# Patient Record
Sex: Male | Born: 1960 | Race: Asian | Hispanic: No | Marital: Married | Smoking: Never smoker
Health system: Southern US, Community
[De-identification: ages and names within clinical notes are randomized; demographics above are authoritative.]

## PROBLEM LIST (undated history)

## (undated) DIAGNOSIS — E7800 Pure hypercholesterolemia, unspecified: Secondary | ICD-10-CM

## (undated) DIAGNOSIS — E119 Type 2 diabetes mellitus without complications: Secondary | ICD-10-CM

## (undated) DIAGNOSIS — K225 Diverticulum of esophagus, acquired: Secondary | ICD-10-CM

## (undated) DIAGNOSIS — E785 Hyperlipidemia, unspecified: Secondary | ICD-10-CM

## (undated) DIAGNOSIS — G5600 Carpal tunnel syndrome, unspecified upper limb: Secondary | ICD-10-CM

## (undated) DIAGNOSIS — I10 Essential (primary) hypertension: Secondary | ICD-10-CM

## (undated) HISTORY — PX: SHOULDER SURGERY: SHX246

## (undated) HISTORY — PX: FOOT SURGERY: SHX648

## (undated) HISTORY — PX: COLONOSCOPY: SHX174

---

## 2004-05-06 ENCOUNTER — Encounter: Admission: RE | Admit: 2004-05-06 | Discharge: 2004-08-04 | Payer: Self-pay | Admitting: Family Medicine

## 2014-06-04 ENCOUNTER — Other Ambulatory Visit: Payer: Self-pay | Admitting: Family Medicine

## 2014-06-04 DIAGNOSIS — R221 Localized swelling, mass and lump, neck: Secondary | ICD-10-CM

## 2014-12-26 ENCOUNTER — Other Ambulatory Visit: Payer: Self-pay | Admitting: Orthopedic Surgery

## 2014-12-28 ENCOUNTER — Encounter (HOSPITAL_BASED_OUTPATIENT_CLINIC_OR_DEPARTMENT_OTHER): Payer: Self-pay | Admitting: *Deleted

## 2014-12-28 ENCOUNTER — Encounter (HOSPITAL_BASED_OUTPATIENT_CLINIC_OR_DEPARTMENT_OTHER)
Admission: RE | Admit: 2014-12-28 | Discharge: 2014-12-28 | Disposition: A | Discharge status: Home / Self Care | Payer: 59 | Source: Ambulatory Visit | Attending: Orthopedic Surgery | Admitting: Orthopedic Surgery

## 2014-12-28 DIAGNOSIS — E785 Hyperlipidemia, unspecified: Secondary | ICD-10-CM | POA: Diagnosis not present

## 2014-12-28 DIAGNOSIS — E119 Type 2 diabetes mellitus without complications: Secondary | ICD-10-CM | POA: Diagnosis not present

## 2014-12-28 DIAGNOSIS — G5602 Carpal tunnel syndrome, left upper limb: Secondary | ICD-10-CM | POA: Diagnosis not present

## 2014-12-28 DIAGNOSIS — R200 Anesthesia of skin: Secondary | ICD-10-CM | POA: Diagnosis present

## 2014-12-28 DIAGNOSIS — I10 Essential (primary) hypertension: Secondary | ICD-10-CM | POA: Diagnosis not present

## 2014-12-28 DIAGNOSIS — Z79899 Other long term (current) drug therapy: Secondary | ICD-10-CM | POA: Diagnosis not present

## 2014-12-28 LAB — BASIC METABOLIC PANEL
Anion gap: 10 (ref 5–15)
BUN: 15 mg/dL (ref 6–20)
CHLORIDE: 100 mmol/L — AB (ref 101–111)
CO2: 26 mmol/L (ref 22–32)
CREATININE: 1.03 mg/dL (ref 0.61–1.24)
Calcium: 9.5 mg/dL (ref 8.9–10.3)
GFR calc Af Amer: >60 mL/min (ref >60)
GFR calc non Af Amer: >60 mL/min (ref >60)
GLUCOSE: 186 mg/dL — AB (ref 65–99)
Potassium: 4.8 mmol/L (ref 3.5–5.1)
Sodium: 136 mmol/L (ref 135–145)

## 2014-12-31 ENCOUNTER — Ambulatory Visit (HOSPITAL_BASED_OUTPATIENT_CLINIC_OR_DEPARTMENT_OTHER): Payer: 59 | Admitting: Anesthesiology

## 2014-12-31 ENCOUNTER — Encounter (HOSPITAL_BASED_OUTPATIENT_CLINIC_OR_DEPARTMENT_OTHER)
Admission: RE | Disposition: A | Discharge status: Home / Self Care | Payer: Self-pay | Source: Ambulatory Visit | Attending: Orthopedic Surgery

## 2014-12-31 ENCOUNTER — Encounter (HOSPITAL_BASED_OUTPATIENT_CLINIC_OR_DEPARTMENT_OTHER): Payer: Self-pay | Admitting: *Deleted

## 2014-12-31 ENCOUNTER — Ambulatory Visit (HOSPITAL_BASED_OUTPATIENT_CLINIC_OR_DEPARTMENT_OTHER)
Admission: RE | Admit: 2014-12-31 | Discharge: 2014-12-31 | Disposition: A | Discharge status: Home / Self Care | Payer: 59 | Source: Ambulatory Visit | Attending: Orthopedic Surgery | Admitting: Orthopedic Surgery

## 2014-12-31 DIAGNOSIS — G5602 Carpal tunnel syndrome, left upper limb: Secondary | ICD-10-CM | POA: Diagnosis not present

## 2014-12-31 DIAGNOSIS — E785 Hyperlipidemia, unspecified: Secondary | ICD-10-CM | POA: Insufficient documentation

## 2014-12-31 DIAGNOSIS — Z79899 Other long term (current) drug therapy: Secondary | ICD-10-CM | POA: Insufficient documentation

## 2014-12-31 DIAGNOSIS — I10 Essential (primary) hypertension: Secondary | ICD-10-CM | POA: Insufficient documentation

## 2014-12-31 DIAGNOSIS — E119 Type 2 diabetes mellitus without complications: Secondary | ICD-10-CM | POA: Insufficient documentation

## 2014-12-31 HISTORY — PX: CARPAL TUNNEL RELEASE: SHX101

## 2014-12-31 HISTORY — DX: Carpal tunnel syndrome, unspecified upper limb: G56.00

## 2014-12-31 HISTORY — DX: Essential (primary) hypertension: I10

## 2014-12-31 HISTORY — DX: Hyperlipidemia, unspecified: E78.5

## 2014-12-31 HISTORY — DX: Type 2 diabetes mellitus without complications: E11.9

## 2014-12-31 LAB — GLUCOSE, CAPILLARY
Glucose-Capillary: 186 mg/dL — ABNORMAL HIGH (ref 65–99)
Glucose-Capillary: 166 mg/dL — ABNORMAL HIGH (ref 65–99)

## 2014-12-31 LAB — POCT HEMOGLOBIN-HEMACUE: Hemoglobin: 15.6 g/dL (ref 13.0–17.0)

## 2014-12-31 SURGERY — RELEASE, CARPAL TUNNEL, ENDOSCOPIC
Anesthesia: Monitor Anesthesia Care | Site: Wrist | Laterality: Left

## 2014-12-31 MED ORDER — ONDANSETRON HCL 4 MG/2ML IJ SOLN
INTRAMUSCULAR | Status: DC | PRN
  Administered 2014-12-31: 4 mg via INTRAVENOUS

## 2014-12-31 MED ORDER — LIDOCAINE HCL (CARDIAC) 20 MG/ML IV SOLN
INTRAVENOUS | Status: DC | PRN
  Administered 2014-12-31: 20 mg via INTRAVENOUS

## 2014-12-31 MED ORDER — PROPOFOL 10 MG/ML IV BOLUS
INTRAVENOUS | Status: DC | PRN
  Administered 2014-12-31: 20 mg via INTRAVENOUS

## 2014-12-31 MED ORDER — GLYCOPYRROLATE 0.2 MG/ML IJ SOLN
0.2000 mg | Freq: Once | INTRAMUSCULAR | Status: DC | PRN
Start: 2014-12-31 — End: 2014-12-31

## 2014-12-31 MED ORDER — PROPOFOL INFUSION 10 MG/ML OPTIME: INTRAVENOUS | Status: DC | PRN

## 2014-12-31 MED ORDER — LACTATED RINGERS IV SOLN: INTRAVENOUS | Status: DC

## 2014-12-31 MED ORDER — LIDOCAINE HCL 2 % IJ SOLN
INTRAMUSCULAR | Status: AC
  Filled 2014-12-31: qty 20

## 2014-12-31 MED ORDER — LIDOCAINE HCL 2 % IJ SOLN
INTRAMUSCULAR | Status: DC | PRN
  Administered 2014-12-31: 5 mL

## 2014-12-31 MED ORDER — CEFAZOLIN SODIUM-DEXTROSE 2-3 GM-% IV SOLR
2.0000 g | INTRAVENOUS | Status: AC
  Administered 2014-12-31: 2 g via INTRAVENOUS

## 2014-12-31 MED ORDER — HYDROMORPHONE HCL 1 MG/ML IJ SOLN: 0.2500 mg | INTRAMUSCULAR | Status: DC | PRN

## 2014-12-31 MED ORDER — FENTANYL CITRATE (PF) 100 MCG/2ML IJ SOLN
INTRAMUSCULAR | Status: DC | PRN
  Administered 2014-12-31: 100 ug via INTRAVENOUS

## 2014-12-31 MED ORDER — MIDAZOLAM HCL 2 MG/2ML IJ SOLN
INTRAMUSCULAR | Status: AC
  Filled 2014-12-31: qty 2

## 2014-12-31 MED ORDER — BUPIVACAINE-EPINEPHRINE (PF) 0.5% -1:200000 IJ SOLN
INTRAMUSCULAR | Status: DC | PRN
Start: 2014-12-31 — End: 2014-12-31
  Administered 2014-12-31: 5 mL

## 2014-12-31 MED ORDER — LACTATED RINGERS IV SOLN
INTRAVENOUS | Status: DC
  Administered 2014-12-31: 11:00:00 via INTRAVENOUS

## 2014-12-31 MED ORDER — MIDAZOLAM HCL 2 MG/2ML IJ SOLN
1.0000 mg | INTRAMUSCULAR | Status: DC | PRN
  Administered 2014-12-31: 2 mg via INTRAVENOUS

## 2014-12-31 MED ORDER — CEFAZOLIN SODIUM-DEXTROSE 2-3 GM-% IV SOLR
INTRAVENOUS | Status: AC
  Filled 2014-12-31: qty 50

## 2014-12-31 MED ORDER — FENTANYL CITRATE (PF) 100 MCG/2ML IJ SOLN
50.0000 ug | INTRAMUSCULAR | Status: DC | PRN
Start: 2014-12-31 — End: 2014-12-31

## 2014-12-31 MED ORDER — FENTANYL CITRATE (PF) 100 MCG/2ML IJ SOLN
INTRAMUSCULAR | Status: AC
  Filled 2014-12-31: qty 4

## 2014-12-31 MED ORDER — PROMETHAZINE HCL 25 MG/ML IJ SOLN: 6.2500 mg | INTRAMUSCULAR | Status: DC | PRN

## 2014-12-31 MED ORDER — BUPIVACAINE-EPINEPHRINE (PF) 0.5% -1:200000 IJ SOLN
INTRAMUSCULAR | Status: AC
  Filled 2014-12-31: qty 30

## 2014-12-31 SURGICAL SUPPLY — 40 items
APPLICATOR COTTON TIP 6IN STRL (MISCELLANEOUS) ×2 IMPLANT
BLADE HOOK ENDO STRL (BLADE) ×2 IMPLANT
BLADE SURG 15 STRL LF DISP TIS (BLADE) ×1 IMPLANT
BLADE SURG 15 STRL SS (BLADE) ×1
BLADE TRIANGLE EPF/EGR ENDO (BLADE) ×2 IMPLANT
BNDG COHESIVE 4X5 TAN STRL (GAUZE/BANDAGES/DRESSINGS) ×2 IMPLANT
BNDG ESMARK 4X9 LF (GAUZE/BANDAGES/DRESSINGS) ×2 IMPLANT
BNDG GAUZE ELAST 4 BULKY (GAUZE/BANDAGES/DRESSINGS) ×2 IMPLANT
CHLORAPREP W/TINT 26ML (MISCELLANEOUS) ×2 IMPLANT
CORDS BIPOLAR (ELECTRODE) IMPLANT
COVER BACK TABLE 60X90IN (DRAPES) ×2 IMPLANT
COVER MAYO STAND STRL (DRAPES) ×2 IMPLANT
CUFF TOURNIQUET SINGLE 18IN (TOURNIQUET CUFF) ×2 IMPLANT
DRAIN TLS ROUND 10FR (DRAIN) ×2 IMPLANT
DRAPE EXTREMITY T 121X128X90 (DRAPE) ×2 IMPLANT
DRAPE SURG 17X23 STRL (DRAPES) ×2 IMPLANT
DRSG EMULSION OIL 3X3 NADH (GAUZE/BANDAGES/DRESSINGS) ×2 IMPLANT
GLOVE BIO SURGEON STRL SZ7.5 (GLOVE) ×2 IMPLANT
GLOVE BIOGEL PI IND STRL 7.0 (GLOVE) ×1 IMPLANT
GLOVE BIOGEL PI IND STRL 8 (GLOVE) ×1 IMPLANT
GLOVE BIOGEL PI INDICATOR 7.0 (GLOVE) ×1
GLOVE BIOGEL PI INDICATOR 8 (GLOVE) ×1
GLOVE ECLIPSE 6.5 STRL STRAW (GLOVE) ×4 IMPLANT
GOWN STRL REUS W/ TWL LRG LVL3 (GOWN DISPOSABLE) ×2 IMPLANT
GOWN STRL REUS W/TWL LRG LVL3 (GOWN DISPOSABLE) ×2
GOWN STRL REUS W/TWL XL LVL3 (GOWN DISPOSABLE) ×2 IMPLANT
NEEDLE HYPO 25X1 1.5 SAFETY (NEEDLE) ×2 IMPLANT
NS IRRIG 1000ML POUR BTL (IV SOLUTION) ×2 IMPLANT
PACK BASIN DAY SURGERY FS (CUSTOM PROCEDURE TRAY) ×2 IMPLANT
PADDING CAST ABS 4INX4YD NS (CAST SUPPLIES) ×1
PADDING CAST ABS COTTON 4X4 ST (CAST SUPPLIES) ×1 IMPLANT
SPONGE GAUZE 4X4 12PLY STER LF (GAUZE/BANDAGES/DRESSINGS) ×2 IMPLANT
STOCKINETTE 6  STRL (DRAPES) ×1
STOCKINETTE 6 STRL (DRAPES) ×1 IMPLANT
SUT VICRYL RAPIDE 4-0 (SUTURE) ×2 IMPLANT
SYR BULB 3OZ (MISCELLANEOUS) ×2 IMPLANT
SYRINGE 10CC LL (SYRINGE) ×2 IMPLANT
TOWEL OR 17X24 6PK STRL BLUE (TOWEL DISPOSABLE) ×4 IMPLANT
TOWEL OR NON WOVEN STRL DISP B (DISPOSABLE) ×2 IMPLANT
UNDERPAD 30X30 (UNDERPADS AND DIAPERS) ×2 IMPLANT

## 2014-12-31 NOTE — Anesthesia Postprocedure Evaluation (Signed)
  Anesthesia Post-op Note  Patient: Timothy RippleNiaz Moss  Procedure(s) Performed: Procedure(s): LEFT ENDOSCOPIC CARPAL TUNNEL RELEASE  (Left)  Patient Location: PACU  Anesthesia Type:MAC  Level of Consciousness: awake  Airway and Oxygen Therapy: Patient Spontanous Breathing  Post-op Pain: mild  Post-op Assessment: Post-op Vital signs reviewed  Post-op Vital Signs: Reviewed  Last Vitals:  Filed Vitals:   12/31/14 1350  BP: 140/84  Pulse: 67  Temp: 36.5 C  Resp: 16    Complications: No apparent anesthesia complications

## 2014-12-31 NOTE — H&P (Signed)
Timothy Moss is an 54 y.o. male.   Chief Complaint: L hand N/T HPI: h/o L hand N/T in MN distribution, worse at sleep and certain activities.  NCS/EMG c/w CTS.  Non-op mgmnt failed.  Past Medical History  Diagnosis Date  . Diabetes mellitus without complication   . Hypertension   . CTS (carpal tunnel syndrome)   . Hyperlipemia     Past Surgical History  Procedure Laterality Date  . Colonoscopy      History reviewed. No pertinent family history. Social History:  reports that he has never smoked. He does not have any smokeless tobacco history on file. He reports that he does not drink alcohol or use illicit drugs.  Allergies: No Known Allergies  Medications Prior to Admission  Medication Sig Dispense Refill  . fenofibrate (TRICOR) 145 MG tablet Take 145 mg by mouth daily.    Marland Kitchen. losartan (COZAAR) 50 MG tablet Take 50 mg by mouth daily.    . metFORMIN (GLUCOPHAGE) 500 MG tablet Take by mouth 2 (two) times daily with a meal.    . pravastatin (PRAVACHOL) 40 MG tablet Take 40 mg by mouth daily.      Results for orders placed or performed during the hospital encounter of 12/31/14 (from the past 48 hour(s))  Glucose, capillary     Status: Abnormal   Collection Time: 12/31/14 10:36 AM  Result Value Ref Range   Glucose-Capillary 186 (H) 65 - 99 mg/dL   No results found.  Review of Systems  All other systems reviewed and are negative.   Blood pressure 151/88, pulse 71, temperature 97.8 F (36.6 C), temperature source Oral, resp. rate 20, height 5\' 8"  (1.727 m), weight 79.379 kg (175 lb), SpO2 100 %. Physical Exam  Constitutional: He is oriented to person, place, and time. He appears well-developed and well-nourished.  HENT:  Head: Normocephalic and atraumatic.  Neck: Normal range of motion.  Cardiovascular: Intact distal pulses.   Respiratory: Effort normal.  GI: Soft.  Musculoskeletal: Normal range of motion.  +phalens on L, +tinels over MN at wrist  Neurological: He is  alert and oriented to person, place, and time. He has normal reflexes.  Skin: Skin is warm and dry.  Psychiatric: He has a normal mood and affect. His behavior is normal. Judgment and thought content normal.     Assessment/Plan L CTS  To OR for ECTR, G/R/O reviewed and consent obtained.  Endiya Klahr A. 12/31/2014, 11:56 AM

## 2014-12-31 NOTE — Op Note (Signed)
12/31/2014  11:59 AM  PATIENT:  Timothy RippleNiaz Moss  54 y.o. male  PRE-OPERATIVE DIAGNOSIS:  left carpal tunnel syndrome  POST-OPERATIVE DIAGNOSIS:  Same  PROCEDURE:  Procedure(s): LEFT ENDOSCOPIC CARPAL TUNNEL RELEASE   SURGEON:  Surgeon(s): Mack Hookavid Merriel Zinger, MD  PHYSICIAN ASSISTANT: Danielle RankinKirsten Schrader, OPA-C  ANESTHESIA:  Local / MAC  SPECIMENS:  None  DRAINS:   TLS x 1  EBL:  less than 50 mL  PREOPERATIVE INDICATIONS:  Timothy Moss is a  54 y.o. male with a diagnosis of left carpal tunnel syndrome who failed conservative measures and elected for surgical management.    The risks benefits and alternatives were discussed with the patient preoperatively including but not limited to the risks of infection, bleeding, nerve injury, cardiopulmonary complications, the need for revision surgery, among others, and the patient verbalized understanding and consented to proceed.  OPERATIVE IMPLANTS: None  OPERATIVE FINDINGS: Tight carpal tunnel, acceptably decompressed following release  OPERATIVE PROCEDURE:  After receiving prophylactic antibiotics, the patient was escorted to the operative theatre and placed in a supine position.   A surgical "time-out" was performed during which the planned procedure, proposed operative site, and the correct patient identity were compared to the operative consent and agreement confirmed by the circulating nurse according to current facility policy.  Following application of a tourniquet to the operative extremity, the planned incisions were marked and anesthetized with a mixture of lidocaine & bupivicaine containing epinephrine.  The exposed skin was prepped with Chloraprep and draped in the usual sterile fashion.  The limb was exsanguinated with an Esmarch bandage and the tourniquet inflated to approximately 100mmHg higher than systolic BP.   The incisions were made sharply. Subcutaneous tissues were dissected with blunt and spreading dissection. At the  proximal incision, the deep forearm fascia was split in line with the skin incision the distal edge grasped with a hemostat. At the mid palmar incision, the palmar fascia was split in line with the skin incision, revealing the underlying superficial palmar arch which was visualized with loupe assisted magnification. The synovial reflector was then introduced into the proximal incision and passed through the carpal canal, uses to reflect synovium from the deep surface of the transverse carpal ligament. It was removed and replaced with the slotted cannula and blunt obturator, passing from proximal to distal and exiting the distal wound superficial to the superficial palmar arch. The obturator was removed and the camera inserted. Visualization of the ligament was acceptable. The triangle shape blade was then inserted distally, advanced to the midportion of the ligament, and there used to create a perforation in the ligament. This instrument was removed and the hooked nstrument was inserted. It was placed into the perforation in the ligament and withdrawn distally, completing transection of the distal half of the ligament. The camera was then removed and placed into the distal end of the cannula. The hooked instrument was placed into the proximal end and advanced facility to be placed into the apex of the V. which had been formed to the distal hemi-transection of the ligament. It was withdrawn proximally, completing transection of the ligament. The adequacy of the release was judged with the scope and the instruments used as a probe. All of the endoscopic instruments are removed and the adequacy of release was again judged from the proximal incision perspective with direct loupe assisted visualization. In addition, the proximal forearm fascia was split for 2 inches proximal to the proximal incision under direct visualization using a sliding scissor technique. The tourniquet  was released, the wound copiously irrigated. A  TLS drain was placed to lie alongside the nerve exiting its own skin hole distally made with some sharp trocar. The skin was then closed with 4-0 Vicryl Rapide interrupted sutures. A light dressing was applied and the balance of 10 mL of the initial anesthetic mixture was placed down the drain, and the drain was clamped to be unclamped in the recovery room.  DISPOSITION:  The patient will be discharged home today, returning in 10-15 days for re-assessment.

## 2014-12-31 NOTE — Discharge Instructions (Signed)
Discharge Instructions   You have a light dressing on your hand.  You may begin gentle motion of your fingers and hand immediately, but you should not do any heavy lifting or gripping.  Elevate your hand to reduce pain & swelling of the digits.  Ice over the operative site may be helpful to reduce pain & swelling.  DO NOT USE HEAT. Pain medicine has been prescribed for you.  Use your medicine as needed over the first 48 hours, and then you can begin to taper your use. You may use Tylenol in place of your prescribed pain medication, but not IN ADDITION to it. Leave the dressing in place until the third day after your surgery and then remove it, leaving it open to air.  After the bandage has been removed you may shower, regularly washing the incision and letting the water run over it, but not submerging it (no swimming, soaking it in dishwater, etc.) You may drive a car when you are off of prescription pain medications and can safely control your vehicle with both hands. We will address whether therapy will be required or not when you return to the office. You may have already made your follow-up appointment when we completed your preop visit.  If not, please call our office today or the next business day to make your return appointment for 10-15 days after surgery.   Please call [phone] during normal business hours or [phone] after hours for any problems. Including the following:  - excessive redness of the incisions - drainage for more than 4 days - fever of more than 101.5 F  *Please note that pain medications will not be refilled after hours or on weekends.  Post Anesthesia Home Care Instructions  Activity: Get plenty of rest for the remainder of the day. A responsible adult should stay with you for 24 hours following the procedure.  For the next 24 hours, DO NOT: -Drive a car -Advertising copywriterperate machinery -Drink alcoholic beverages -Take any medication unless instructed by your  physician -Make any legal decisions or sign important papers.  Meals: Start with liquid foods such as gelatin or soup. Progress to regular foods as tolerated. Avoid greasy, spicy, heavy foods. If nausea and/or vomiting occur, drink only clear liquids until the nausea and/or vomiting subsides. Call your physician if vomiting continues.  Special Instructions/Symptoms: Your throat may feel dry or sore from the anesthesia or the breathing tube placed in your throat during surgery. If this causes discomfort, gargle with warm salt water. The discomfort should disappear within 24 hours.  If you had a scopolamine patch placed behind your ear for the management of post- operative nausea and/or vomiting:  1. The medication in the patch is effective for 72 hours, after which it should be removed.  Wrap patch in a tissue and discard in the trash. Wash hands thoroughly with soap and water. 2. You may remove the patch earlier than 72 hours if you experience unpleasant side effects which may include dry mouth, dizziness or visual disturbances. 3. Avoid touching the patch. Wash your hands with soap and water after contact with the patch.   TLS Drain Instructions You have a drain tube in place to help limit the amount of blood that collects under your skin in the early post-operative period.  The amount it drains will vary from person-to-person and is dependent upon many factors.  You will have 1-2 extra drain tubes sent with you from the facility.  You should change the tube  according to these instructions below when the tube is about half full.  BE SURE TO SLIDE THE CLAMP TO THE CLOSED POSITION BEFORE CHANGING THE TUBE.    RE-OPEN THE CLAMP ONCE THE GLASS EVACUATION TUBE HAS BEEN CHANGED.  24 hours after surgery the amount of drainage will likely be negligible and it will be time to remove the tube and discard it.  Simply remove the tape that secures the flexible plastic drainage tube to the bandage and  briskly pull the tube straight out.  You will likely feel a little discomfort during this process, but the tube should slide out as it is not sewn or otherwise secured directly to your body.  It is merely held in place by the bandage itself.                              If you are not clear about when your first post-op appointment is scheduled, please call the office on the next business day to inquire about it.  Please also call the office if you have any other questions [phone]).

## 2014-12-31 NOTE — Anesthesia Preprocedure Evaluation (Addendum)
Anesthesia Evaluation  Patient identified by MRN, date of birth, ID band Patient awake    Reviewed: Allergy & Precautions, NPO status , Patient's Chart, lab work & pertinent test results  Airway Mallampati: II  TM Distance: >3 FB Neck ROM: Full    Dental   Pulmonary neg pulmonary ROS,  breath sounds clear to auscultation        Cardiovascular hypertension, Rhythm:Regular Rate:Normal     Neuro/Psych    GI/Hepatic negative GI ROS, Neg liver ROS,   Endo/Other  diabetes  Renal/GU negative Renal ROS     Musculoskeletal   Abdominal   Peds  Hematology   Anesthesia Other Findings   Reproductive/Obstetrics                            Anesthesia Physical Anesthesia Plan  ASA: II  Anesthesia Plan: MAC   Post-op Pain Management:    Induction: Intravenous  Airway Management Planned: Simple Face Mask  Additional Equipment:   Intra-op Plan:   Post-operative Plan:   Informed Consent: I have reviewed the patients History and Physical, chart, labs and discussed the procedure including the risks, benefits and alternatives for the proposed anesthesia with the patient or authorized representative who has indicated his/her understanding and acceptance.   Dental advisory given  Plan Discussed with: CRNA and Anesthesiologist  Anesthesia Plan Comments:        Anesthesia Quick Evaluation

## 2014-12-31 NOTE — Anesthesia Procedure Notes (Signed)
Procedure Name: MAC Performed by: Keyara Ent W Pre-anesthesia Checklist: Patient identified, Timeout performed, Emergency Drugs available, Suction available and Patient being monitored Patient Re-evaluated:Patient Re-evaluated prior to inductionOxygen Delivery Method: Simple face mask Placement Confirmation: positive ETCO2     

## 2014-12-31 NOTE — Transfer of Care (Signed)
Immediate Anesthesia Transfer of Care Note  Patient: Timothy Moss  Procedure(s) Performed: Procedure(s): LEFT ENDOSCOPIC CARPAL TUNNEL RELEASE  (Left)  Patient Location: PACU  Anesthesia Type:MAC  Level of Consciousness: awake, alert  and oriented  Airway & Oxygen Therapy: Patient Spontanous Breathing and Patient connected to face mask oxygen  Post-op Assessment: Report given to RN and Post -op Vital signs reviewed and stable  Post vital signs: Reviewed and stable  Last Vitals:  Filed Vitals:   12/31/14 1028  BP: 151/88  Pulse: 71  Temp: 36.6 C  Resp: 20    Complications: No apparent anesthesia complications

## 2015-01-01 ENCOUNTER — Encounter (HOSPITAL_BASED_OUTPATIENT_CLINIC_OR_DEPARTMENT_OTHER): Payer: Self-pay | Admitting: Orthopedic Surgery

## 2015-05-01 ENCOUNTER — Ambulatory Visit
Admission: RE | Admit: 2015-05-01 | Discharge: 2015-05-01 | Disposition: A | Discharge status: Home / Self Care | Payer: 59 | Source: Ambulatory Visit | Attending: Family Medicine | Admitting: Family Medicine

## 2015-05-01 ENCOUNTER — Other Ambulatory Visit: Payer: Self-pay | Admitting: Family Medicine

## 2015-05-01 DIAGNOSIS — M545 Low back pain: Secondary | ICD-10-CM

## 2015-11-13 DIAGNOSIS — R05 Cough: Secondary | ICD-10-CM | POA: Diagnosis not present

## 2015-11-13 DIAGNOSIS — J309 Allergic rhinitis, unspecified: Secondary | ICD-10-CM | POA: Diagnosis not present

## 2015-12-19 DIAGNOSIS — J309 Allergic rhinitis, unspecified: Secondary | ICD-10-CM | POA: Diagnosis not present

## 2015-12-19 DIAGNOSIS — E1329 Other specified diabetes mellitus with other diabetic kidney complication: Secondary | ICD-10-CM | POA: Diagnosis not present

## 2015-12-19 DIAGNOSIS — R05 Cough: Secondary | ICD-10-CM | POA: Diagnosis not present

## 2016-01-09 DIAGNOSIS — M79671 Pain in right foot: Secondary | ICD-10-CM | POA: Diagnosis not present

## 2016-01-17 DIAGNOSIS — D225 Melanocytic nevi of trunk: Secondary | ICD-10-CM | POA: Diagnosis not present

## 2016-01-17 DIAGNOSIS — Z1283 Encounter for screening for malignant neoplasm of skin: Secondary | ICD-10-CM | POA: Diagnosis not present

## 2016-02-20 DIAGNOSIS — M25571 Pain in right ankle and joints of right foot: Secondary | ICD-10-CM | POA: Diagnosis not present

## 2016-02-20 DIAGNOSIS — M7731 Calcaneal spur, right foot: Secondary | ICD-10-CM | POA: Diagnosis not present

## 2016-02-20 DIAGNOSIS — M7661 Achilles tendinitis, right leg: Secondary | ICD-10-CM | POA: Diagnosis not present

## 2016-02-20 DIAGNOSIS — M6701 Short Achilles tendon (acquired), right ankle: Secondary | ICD-10-CM | POA: Diagnosis not present

## 2016-03-03 DIAGNOSIS — M25571 Pain in right ankle and joints of right foot: Secondary | ICD-10-CM | POA: Diagnosis not present

## 2016-03-03 DIAGNOSIS — G8929 Other chronic pain: Secondary | ICD-10-CM | POA: Diagnosis not present

## 2016-03-10 DIAGNOSIS — M25571 Pain in right ankle and joints of right foot: Secondary | ICD-10-CM | POA: Diagnosis not present

## 2016-03-18 DIAGNOSIS — M25571 Pain in right ankle and joints of right foot: Secondary | ICD-10-CM | POA: Diagnosis not present

## 2016-03-19 DIAGNOSIS — M7731 Calcaneal spur, right foot: Secondary | ICD-10-CM | POA: Diagnosis not present

## 2016-03-19 DIAGNOSIS — M6701 Short Achilles tendon (acquired), right ankle: Secondary | ICD-10-CM | POA: Diagnosis not present

## 2016-03-19 DIAGNOSIS — M25571 Pain in right ankle and joints of right foot: Secondary | ICD-10-CM | POA: Diagnosis not present

## 2016-03-19 DIAGNOSIS — M7661 Achilles tendinitis, right leg: Secondary | ICD-10-CM | POA: Diagnosis not present

## 2016-03-24 DIAGNOSIS — M25571 Pain in right ankle and joints of right foot: Secondary | ICD-10-CM | POA: Diagnosis not present

## 2016-03-26 DIAGNOSIS — H5712 Ocular pain, left eye: Secondary | ICD-10-CM | POA: Diagnosis not present

## 2016-03-26 DIAGNOSIS — R109 Unspecified abdominal pain: Secondary | ICD-10-CM | POA: Diagnosis not present

## 2016-03-27 ENCOUNTER — Other Ambulatory Visit: Payer: Self-pay | Admitting: Family Medicine

## 2016-03-27 DIAGNOSIS — R109 Unspecified abdominal pain: Secondary | ICD-10-CM

## 2016-03-27 DIAGNOSIS — R1030 Lower abdominal pain, unspecified: Secondary | ICD-10-CM

## 2016-03-30 DIAGNOSIS — M25571 Pain in right ankle and joints of right foot: Secondary | ICD-10-CM | POA: Diagnosis not present

## 2016-03-30 DIAGNOSIS — M7661 Achilles tendinitis, right leg: Secondary | ICD-10-CM | POA: Diagnosis not present

## 2016-04-09 ENCOUNTER — Ambulatory Visit
Admission: RE | Admit: 2016-04-09 | Discharge: 2016-04-09 | Disposition: A | Discharge status: Home / Self Care | Payer: BLUE CROSS/BLUE SHIELD | Source: Ambulatory Visit | Attending: Family Medicine | Admitting: Family Medicine

## 2016-04-09 DIAGNOSIS — M25571 Pain in right ankle and joints of right foot: Secondary | ICD-10-CM | POA: Diagnosis not present

## 2016-04-09 DIAGNOSIS — M7661 Achilles tendinitis, right leg: Secondary | ICD-10-CM | POA: Diagnosis not present

## 2016-04-09 DIAGNOSIS — E1329 Other specified diabetes mellitus with other diabetic kidney complication: Secondary | ICD-10-CM | POA: Diagnosis not present

## 2016-04-09 DIAGNOSIS — R1030 Lower abdominal pain, unspecified: Secondary | ICD-10-CM

## 2016-04-09 DIAGNOSIS — M7731 Calcaneal spur, right foot: Secondary | ICD-10-CM | POA: Diagnosis not present

## 2016-04-09 DIAGNOSIS — R109 Unspecified abdominal pain: Secondary | ICD-10-CM

## 2016-04-09 DIAGNOSIS — M6701 Short Achilles tendon (acquired), right ankle: Secondary | ICD-10-CM | POA: Diagnosis not present

## 2016-04-09 DIAGNOSIS — K4020 Bilateral inguinal hernia, without obstruction or gangrene, not specified as recurrent: Secondary | ICD-10-CM | POA: Diagnosis not present

## 2016-04-09 MED ORDER — IOPAMIDOL (ISOVUE-300) INJECTION 61%
100.0000 mL | Freq: Once | INTRAVENOUS | Status: AC | PRN
  Administered 2016-04-09: 100 mL via INTRAVENOUS

## 2016-04-14 DIAGNOSIS — M25571 Pain in right ankle and joints of right foot: Secondary | ICD-10-CM | POA: Diagnosis not present

## 2016-04-14 DIAGNOSIS — R351 Nocturia: Secondary | ICD-10-CM | POA: Diagnosis not present

## 2016-04-14 DIAGNOSIS — R102 Pelvic and perineal pain: Secondary | ICD-10-CM | POA: Diagnosis not present

## 2016-04-22 DIAGNOSIS — M25571 Pain in right ankle and joints of right foot: Secondary | ICD-10-CM | POA: Diagnosis not present

## 2016-04-30 DIAGNOSIS — M25571 Pain in right ankle and joints of right foot: Secondary | ICD-10-CM | POA: Diagnosis not present

## 2016-05-13 DIAGNOSIS — R351 Nocturia: Secondary | ICD-10-CM | POA: Diagnosis not present

## 2016-05-13 DIAGNOSIS — M7661 Achilles tendinitis, right leg: Secondary | ICD-10-CM | POA: Diagnosis not present

## 2016-05-13 DIAGNOSIS — M6701 Short Achilles tendon (acquired), right ankle: Secondary | ICD-10-CM | POA: Diagnosis not present

## 2016-05-13 DIAGNOSIS — M9261 Juvenile osteochondrosis of tarsus, right ankle: Secondary | ICD-10-CM | POA: Diagnosis not present

## 2016-05-20 DIAGNOSIS — R102 Pelvic and perineal pain: Secondary | ICD-10-CM | POA: Diagnosis not present

## 2016-05-22 DIAGNOSIS — I10 Essential (primary) hypertension: Secondary | ICD-10-CM | POA: Diagnosis not present

## 2016-05-22 DIAGNOSIS — R42 Dizziness and giddiness: Secondary | ICD-10-CM | POA: Diagnosis not present

## 2016-06-12 DIAGNOSIS — M79644 Pain in right finger(s): Secondary | ICD-10-CM | POA: Diagnosis not present

## 2016-06-12 DIAGNOSIS — M25511 Pain in right shoulder: Secondary | ICD-10-CM | POA: Diagnosis not present

## 2016-06-20 DIAGNOSIS — M25511 Pain in right shoulder: Secondary | ICD-10-CM | POA: Diagnosis not present

## 2016-06-20 DIAGNOSIS — M25611 Stiffness of right shoulder, not elsewhere classified: Secondary | ICD-10-CM | POA: Diagnosis not present

## 2016-06-20 DIAGNOSIS — M7541 Impingement syndrome of right shoulder: Secondary | ICD-10-CM | POA: Diagnosis not present

## 2016-07-21 DIAGNOSIS — M7661 Achilles tendinitis, right leg: Secondary | ICD-10-CM | POA: Diagnosis not present

## 2016-07-21 DIAGNOSIS — M9261 Juvenile osteochondrosis of tarsus, right ankle: Secondary | ICD-10-CM | POA: Diagnosis not present

## 2016-07-21 DIAGNOSIS — M66871 Spontaneous rupture of other tendons, right ankle and foot: Secondary | ICD-10-CM | POA: Diagnosis not present

## 2016-07-21 DIAGNOSIS — M7731 Calcaneal spur, right foot: Secondary | ICD-10-CM | POA: Diagnosis not present

## 2016-07-21 DIAGNOSIS — G8918 Other acute postprocedural pain: Secondary | ICD-10-CM | POA: Diagnosis not present

## 2016-07-21 DIAGNOSIS — M6701 Short Achilles tendon (acquired), right ankle: Secondary | ICD-10-CM | POA: Diagnosis not present

## 2016-07-21 DIAGNOSIS — M25774 Osteophyte, right foot: Secondary | ICD-10-CM | POA: Diagnosis not present

## 2016-07-21 DIAGNOSIS — M7660 Achilles tendinitis, unspecified leg: Secondary | ICD-10-CM | POA: Diagnosis not present

## 2016-07-22 DIAGNOSIS — M7730 Calcaneal spur, unspecified foot: Secondary | ICD-10-CM | POA: Diagnosis not present

## 2016-08-05 DIAGNOSIS — M6701 Short Achilles tendon (acquired), right ankle: Secondary | ICD-10-CM | POA: Diagnosis not present

## 2016-08-05 DIAGNOSIS — M66871 Spontaneous rupture of other tendons, right ankle and foot: Secondary | ICD-10-CM | POA: Diagnosis not present

## 2016-08-22 DIAGNOSIS — M7730 Calcaneal spur, unspecified foot: Secondary | ICD-10-CM | POA: Diagnosis not present

## 2016-09-22 DIAGNOSIS — M7730 Calcaneal spur, unspecified foot: Secondary | ICD-10-CM | POA: Diagnosis not present

## 2016-09-30 DIAGNOSIS — Z0000 Encounter for general adult medical examination without abnormal findings: Secondary | ICD-10-CM | POA: Diagnosis not present

## 2016-09-30 DIAGNOSIS — Z8739 Personal history of other diseases of the musculoskeletal system and connective tissue: Secondary | ICD-10-CM | POA: Diagnosis not present

## 2016-09-30 DIAGNOSIS — E1329 Other specified diabetes mellitus with other diabetic kidney complication: Secondary | ICD-10-CM | POA: Diagnosis not present

## 2016-09-30 DIAGNOSIS — E782 Mixed hyperlipidemia: Secondary | ICD-10-CM | POA: Diagnosis not present

## 2016-10-05 DIAGNOSIS — R809 Proteinuria, unspecified: Secondary | ICD-10-CM | POA: Diagnosis not present

## 2016-10-08 DIAGNOSIS — E119 Type 2 diabetes mellitus without complications: Secondary | ICD-10-CM | POA: Diagnosis not present

## 2016-10-08 DIAGNOSIS — Z1283 Encounter for screening for malignant neoplasm of skin: Secondary | ICD-10-CM | POA: Diagnosis not present

## 2016-10-12 DIAGNOSIS — M7661 Achilles tendinitis, right leg: Secondary | ICD-10-CM | POA: Diagnosis not present

## 2016-10-19 DIAGNOSIS — M7661 Achilles tendinitis, right leg: Secondary | ICD-10-CM | POA: Diagnosis not present

## 2016-10-20 DIAGNOSIS — M7730 Calcaneal spur, unspecified foot: Secondary | ICD-10-CM | POA: Diagnosis not present

## 2016-10-26 DIAGNOSIS — M7661 Achilles tendinitis, right leg: Secondary | ICD-10-CM | POA: Diagnosis not present

## 2016-10-30 DIAGNOSIS — M25511 Pain in right shoulder: Secondary | ICD-10-CM | POA: Diagnosis not present

## 2016-10-30 DIAGNOSIS — M25512 Pain in left shoulder: Secondary | ICD-10-CM | POA: Diagnosis not present

## 2016-10-30 DIAGNOSIS — M7542 Impingement syndrome of left shoulder: Secondary | ICD-10-CM | POA: Diagnosis not present

## 2016-10-30 DIAGNOSIS — M7541 Impingement syndrome of right shoulder: Secondary | ICD-10-CM | POA: Diagnosis not present

## 2016-11-04 DIAGNOSIS — M7661 Achilles tendinitis, right leg: Secondary | ICD-10-CM | POA: Diagnosis not present

## 2016-11-11 DIAGNOSIS — M7661 Achilles tendinitis, right leg: Secondary | ICD-10-CM | POA: Diagnosis not present

## 2016-11-16 DIAGNOSIS — Z4789 Encounter for other orthopedic aftercare: Secondary | ICD-10-CM | POA: Diagnosis not present

## 2016-11-20 DIAGNOSIS — M25511 Pain in right shoulder: Secondary | ICD-10-CM | POA: Diagnosis not present

## 2016-11-26 DIAGNOSIS — B349 Viral infection, unspecified: Secondary | ICD-10-CM | POA: Diagnosis not present

## 2016-11-27 DIAGNOSIS — M25511 Pain in right shoulder: Secondary | ICD-10-CM | POA: Diagnosis not present

## 2016-12-09 DIAGNOSIS — I10 Essential (primary) hypertension: Secondary | ICD-10-CM | POA: Diagnosis not present

## 2016-12-09 DIAGNOSIS — M25511 Pain in right shoulder: Secondary | ICD-10-CM | POA: Diagnosis not present

## 2016-12-14 ENCOUNTER — Other Ambulatory Visit: Payer: Self-pay | Admitting: Specialist

## 2016-12-21 DIAGNOSIS — S43431A Superior glenoid labrum lesion of right shoulder, initial encounter: Secondary | ICD-10-CM | POA: Diagnosis not present

## 2016-12-21 DIAGNOSIS — M7541 Impingement syndrome of right shoulder: Secondary | ICD-10-CM | POA: Diagnosis not present

## 2016-12-21 DIAGNOSIS — G8918 Other acute postprocedural pain: Secondary | ICD-10-CM | POA: Diagnosis not present

## 2016-12-21 DIAGNOSIS — M7522 Bicipital tendinitis, left shoulder: Secondary | ICD-10-CM | POA: Diagnosis not present

## 2016-12-21 DIAGNOSIS — M24111 Other articular cartilage disorders, right shoulder: Secondary | ICD-10-CM | POA: Diagnosis not present

## 2016-12-21 DIAGNOSIS — M7521 Bicipital tendinitis, right shoulder: Secondary | ICD-10-CM | POA: Diagnosis not present

## 2017-01-08 DIAGNOSIS — M25571 Pain in right ankle and joints of right foot: Secondary | ICD-10-CM | POA: Diagnosis not present

## 2017-01-19 DIAGNOSIS — M25511 Pain in right shoulder: Secondary | ICD-10-CM | POA: Diagnosis not present

## 2017-01-20 DIAGNOSIS — M25511 Pain in right shoulder: Secondary | ICD-10-CM | POA: Diagnosis not present

## 2017-01-25 DIAGNOSIS — M25511 Pain in right shoulder: Secondary | ICD-10-CM | POA: Diagnosis not present

## 2017-01-27 DIAGNOSIS — M25511 Pain in right shoulder: Secondary | ICD-10-CM | POA: Diagnosis not present

## 2017-02-01 DIAGNOSIS — M25511 Pain in right shoulder: Secondary | ICD-10-CM | POA: Diagnosis not present

## 2017-02-05 DIAGNOSIS — M25511 Pain in right shoulder: Secondary | ICD-10-CM | POA: Diagnosis not present

## 2017-02-09 DIAGNOSIS — M25511 Pain in right shoulder: Secondary | ICD-10-CM | POA: Diagnosis not present

## 2017-02-11 DIAGNOSIS — M7661 Achilles tendinitis, right leg: Secondary | ICD-10-CM | POA: Diagnosis not present

## 2017-02-12 DIAGNOSIS — M25511 Pain in right shoulder: Secondary | ICD-10-CM | POA: Diagnosis not present

## 2017-02-15 DIAGNOSIS — M25511 Pain in right shoulder: Secondary | ICD-10-CM | POA: Diagnosis not present

## 2017-02-17 DIAGNOSIS — M25511 Pain in right shoulder: Secondary | ICD-10-CM | POA: Diagnosis not present

## 2017-02-22 DIAGNOSIS — M25511 Pain in right shoulder: Secondary | ICD-10-CM | POA: Diagnosis not present

## 2017-02-24 DIAGNOSIS — M25511 Pain in right shoulder: Secondary | ICD-10-CM | POA: Diagnosis not present

## 2017-03-01 DIAGNOSIS — M25511 Pain in right shoulder: Secondary | ICD-10-CM | POA: Diagnosis not present

## 2017-03-03 DIAGNOSIS — M25511 Pain in right shoulder: Secondary | ICD-10-CM | POA: Diagnosis not present

## 2017-03-08 DIAGNOSIS — M25511 Pain in right shoulder: Secondary | ICD-10-CM | POA: Diagnosis not present

## 2017-03-10 DIAGNOSIS — M25511 Pain in right shoulder: Secondary | ICD-10-CM | POA: Diagnosis not present

## 2017-03-23 DIAGNOSIS — I10 Essential (primary) hypertension: Secondary | ICD-10-CM | POA: Diagnosis not present

## 2017-03-23 DIAGNOSIS — R809 Proteinuria, unspecified: Secondary | ICD-10-CM | POA: Diagnosis not present

## 2017-03-23 DIAGNOSIS — E1165 Type 2 diabetes mellitus with hyperglycemia: Secondary | ICD-10-CM | POA: Diagnosis not present

## 2017-03-23 DIAGNOSIS — Z7984 Long term (current) use of oral hypoglycemic drugs: Secondary | ICD-10-CM | POA: Diagnosis not present

## 2017-03-23 DIAGNOSIS — R21 Rash and other nonspecific skin eruption: Secondary | ICD-10-CM | POA: Diagnosis not present

## 2017-03-23 DIAGNOSIS — E1369 Other specified diabetes mellitus with other specified complication: Secondary | ICD-10-CM | POA: Diagnosis not present

## 2017-03-26 DIAGNOSIS — M25571 Pain in right ankle and joints of right foot: Secondary | ICD-10-CM | POA: Diagnosis not present

## 2017-03-29 DIAGNOSIS — M25571 Pain in right ankle and joints of right foot: Secondary | ICD-10-CM | POA: Diagnosis not present

## 2017-04-02 DIAGNOSIS — M25571 Pain in right ankle and joints of right foot: Secondary | ICD-10-CM | POA: Diagnosis not present

## 2017-04-09 DIAGNOSIS — M7661 Achilles tendinitis, right leg: Secondary | ICD-10-CM | POA: Diagnosis not present

## 2017-04-09 DIAGNOSIS — M25471 Effusion, right ankle: Secondary | ICD-10-CM | POA: Diagnosis not present

## 2017-04-21 DIAGNOSIS — M79671 Pain in right foot: Secondary | ICD-10-CM | POA: Diagnosis not present

## 2017-04-21 DIAGNOSIS — Z23 Encounter for immunization: Secondary | ICD-10-CM | POA: Diagnosis not present

## 2017-06-11 DIAGNOSIS — R05 Cough: Secondary | ICD-10-CM | POA: Diagnosis not present

## 2017-07-02 DIAGNOSIS — Z23 Encounter for immunization: Secondary | ICD-10-CM | POA: Diagnosis not present

## 2017-07-12 DIAGNOSIS — M50021 Cervical disc disorder at C4-C5 level with myelopathy: Secondary | ICD-10-CM | POA: Diagnosis not present

## 2017-08-05 DIAGNOSIS — R1310 Dysphagia, unspecified: Secondary | ICD-10-CM | POA: Diagnosis not present

## 2017-08-05 DIAGNOSIS — R05 Cough: Secondary | ICD-10-CM | POA: Diagnosis not present

## 2017-08-11 DIAGNOSIS — R05 Cough: Secondary | ICD-10-CM | POA: Diagnosis not present

## 2017-09-01 ENCOUNTER — Other Ambulatory Visit: Payer: Self-pay | Admitting: Gastroenterology

## 2017-09-01 DIAGNOSIS — R1310 Dysphagia, unspecified: Secondary | ICD-10-CM

## 2017-09-01 DIAGNOSIS — R197 Diarrhea, unspecified: Secondary | ICD-10-CM | POA: Diagnosis not present

## 2017-09-01 DIAGNOSIS — R1319 Other dysphagia: Secondary | ICD-10-CM

## 2017-09-02 DIAGNOSIS — R197 Diarrhea, unspecified: Secondary | ICD-10-CM | POA: Diagnosis not present

## 2017-09-08 ENCOUNTER — Ambulatory Visit
Admission: RE | Admit: 2017-09-08 | Discharge: 2017-09-08 | Disposition: A | Discharge status: Home / Self Care | Payer: BLUE CROSS/BLUE SHIELD | Source: Ambulatory Visit | Attending: Gastroenterology | Admitting: Gastroenterology

## 2017-09-08 DIAGNOSIS — R1310 Dysphagia, unspecified: Secondary | ICD-10-CM

## 2017-09-08 DIAGNOSIS — R1319 Other dysphagia: Secondary | ICD-10-CM

## 2017-09-08 DIAGNOSIS — K449 Diaphragmatic hernia without obstruction or gangrene: Secondary | ICD-10-CM | POA: Diagnosis not present

## 2017-09-09 ENCOUNTER — Other Ambulatory Visit: Payer: BLUE CROSS/BLUE SHIELD

## 2017-09-10 ENCOUNTER — Other Ambulatory Visit (HOSPITAL_COMMUNITY): Payer: Self-pay | Admitting: Gastroenterology

## 2017-09-10 DIAGNOSIS — R1310 Dysphagia, unspecified: Secondary | ICD-10-CM

## 2017-09-14 ENCOUNTER — Ambulatory Visit (HOSPITAL_COMMUNITY)
Admission: RE | Admit: 2017-09-14 | Discharge: 2017-09-14 | Disposition: A | Discharge status: Home / Self Care | Payer: BLUE CROSS/BLUE SHIELD | Source: Ambulatory Visit | Attending: Gastroenterology | Admitting: Gastroenterology

## 2017-09-14 DIAGNOSIS — R1310 Dysphagia, unspecified: Secondary | ICD-10-CM | POA: Diagnosis not present

## 2017-09-14 NOTE — Progress Notes (Signed)
Modified Barium Swallow Progress Note  Patient Details  Name: Thomasene Rippleiaz Orsini MRN: [id number] Date of Birth: 10/22/1960  Today's Date: 09/14/2017  Modified Barium Swallow completed.  Full report located under Chart Review in the Imaging Section.  Brief recommendations include the following:  Clinical Impression  Pt exhibited functional pharyngeal phase of swallow. Mild vallecular residue intermittently sensed. Timing of swallow, pharyngeal contraction and epiglottic delection within normal limits. 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 No penetration or aspiration observed this study however if sip was larger material could enter easily enter laryngeal vestibule. Esophagus viewed with hesitant transit through GE junction (previously diagnosed). Educated pt on precautions to mitigate cervical backflow and penetration/aspiration including swallow twice, small sips, stay up minimum 40 min after meals, drink warm liquids before or during meals and discuss if reflux medicaiton would be indicated with MD.      Tenna ChildSwallow Evaluation Recommendations       SLP Diet Recommendations: Thin liquid;Regular solids   Liquid Administration via: Cup;Straw   Medication Administration: Whole meds with liquid   Supervision: Patient able to self feed   Compensations: Slow rate;Small sips/bites;Multiple dry swallows after each bite/sip;Follow solids with liquid   Postural Changes: Seated upright at 90 degrees;Remain semi-upright after after feeds/meals (Comment)   Oral Care Recommendations: Oral care BID        Royce MacadamiaLitaker, Kendrick Remigio Willis 09/14/2017,1:24 PM   Breck CoonsLisa Willis GeorgetownLitaker M.Ed [employer]P Pager [phone]

## 2017-09-16 ENCOUNTER — Encounter: Payer: Self-pay | Admitting: *Deleted

## 2017-09-17 ENCOUNTER — Ambulatory Visit (INDEPENDENT_AMBULATORY_CARE_PROVIDER_SITE_OTHER)
Admission: RE | Admit: 2017-09-17 | Discharge: 2017-09-17 | Disposition: A | Discharge status: Home / Self Care | Payer: BLUE CROSS/BLUE SHIELD | Source: Ambulatory Visit | Attending: Emergency Medicine | Admitting: Emergency Medicine

## 2017-09-17 ENCOUNTER — Ambulatory Visit (INDEPENDENT_AMBULATORY_CARE_PROVIDER_SITE_OTHER): Payer: BLUE CROSS/BLUE SHIELD | Admitting: Emergency Medicine

## 2017-09-17 ENCOUNTER — Encounter: Payer: Self-pay | Admitting: Emergency Medicine

## 2017-09-17 VITALS — BP 120/70 | HR 73 | Ht 68.5 in | Wt 182.0 lb

## 2017-09-17 DIAGNOSIS — R053 Chronic cough: Secondary | ICD-10-CM | POA: Insufficient documentation

## 2017-09-17 DIAGNOSIS — R05 Cough: Secondary | ICD-10-CM

## 2017-09-17 DIAGNOSIS — E119 Type 2 diabetes mellitus without complications: Secondary | ICD-10-CM | POA: Diagnosis not present

## 2017-09-17 DIAGNOSIS — R059 Cough, unspecified: Secondary | ICD-10-CM

## 2017-09-17 MED ORDER — OMEPRAZOLE 40 MG PO CPDR: 40.0000 mg | DELAYED_RELEASE_CAPSULE | Freq: Every day | ORAL | 5 refills | Status: DC

## 2017-09-17 NOTE — Assessment & Plan Note (Signed)
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  He has received swallowing precautions and instructions from speech therapy.  I will start him on omeprazole so that he can take this and review any improvements with Dr Evette CristalGanem at their follow-up.  I believe he needs pulmonary function testing to ensure no evidence of lower or upper airway obstruction.  We will perform a chest x-ray.  I will follow-up with him to review the results and also assess any benefits from the PPI.

## 2017-09-17 NOTE — Progress Notes (Signed)
Subjective:    Patient ID: Timothy Moss, Timothy Moss    DOB: 08/24/1960, 57 y.o.   MRN: [id number]  HPI 57 year old never smoker with a history of diabetes, hypertension, hyperlipidemia.  Referred today for evaluation of cough. He was well until end of December. He was traveling to [place], developed N/V/D after eating street food. He quickly developed cough. Still has some diarrhea, he reports stool studies negative, being evaluated by Dr Evette CristalGanem.  Also with some dysphagia, food getting stuck. The cough is occasionally productive. Continues to happen, worst at night. No allergy sx. He has intermittent GERD sx. Underwent esophagram 1/30 > hiatal hernia, Zenker's diverticulum. Than a MBS 2/5 >> confirmed the Zenker's with backflow, did not show overt aspiration.    Review of Systems  Constitutional: Negative for fever and unexpected weight change.  HENT: Negative for congestion, dental problem, ear pain, nosebleeds, postnasal drip, rhinorrhea, sinus pressure, sneezing, sore throat and trouble swallowing.   Eyes: Negative for redness and itching.  Respiratory: Positive for cough, shortness of breath and wheezing. Negative for chest tightness.   Cardiovascular: Negative for palpitations and leg swelling.  Gastrointestinal: Positive for diarrhea. Negative for nausea and vomiting.  Genitourinary: Negative for dysuria.  Musculoskeletal: Negative for joint swelling.  Skin: Negative for rash.  Neurological: Negative for headaches.  Hematological: Does not bruise/bleed easily.  Psychiatric/Behavioral: Negative for dysphoric mood. The patient is not nervous/anxious.     Past Medical History:  Diagnosis Date  . CTS (carpal tunnel syndrome)   . Diabetes mellitus without complication (HCC)   . Hyperlipemia   . Hypertension      No family history on file.   Social History   Socioeconomic History  . Marital status: Married    Spouse name: Not on file  . Number of children: Not on file  . Years  of education: Not on file  . Highest education level: Not on file  Social Needs  . Financial resource strain: Not on file  . Food insecurity - worry: Not on file  . Food insecurity - inability: Not on file  . Transportation needs - medical: Not on file  . Transportation needs - non-medical: Not on file  Occupational History  . Not on file  Tobacco Use  . Smoking status: Never Smoker  . Smokeless tobacco: Never Used  Substance and Sexual Activity  . Alcohol use: No  . Drug use: No  . Sexual activity: Not on file  Other Topics Concern  . Not on file  Social History Narrative  . Not on file  works at [employer], no fumes or dusts. Originally from [place], [place] in [place]    No Known Allergies   Outpatient Medications Prior to Visit  Medication Sig Dispense Refill  . fenofibrate (TRICOR) 145 MG tablet Take 145 mg by mouth daily.    Marland Kitchen. losartan (COZAAR) 50 MG tablet Take 50 mg by mouth daily.    . metFORMIN (GLUCOPHAGE) 500 MG tablet Take by mouth 2 (two) times daily with a meal.    . pravastatin (PRAVACHOL) 40 MG tablet Take 40 mg by mouth daily.    . pioglitazone (ACTOS) 15 MG tablet Take 15 mg by mouth daily.     No facility-administered medications prior to visit.         Objective:   Physical Exam Vitals:   09/17/17 0853  BP: 120/70  Pulse: 73  SpO2: 98%  Weight: 182 lb (82.6 kg)  Height: 5' 8.5" (  1.74 m)   Gen: Pleasant, well-nourished, in no distress,  normal affect  ENT: No lesions,  mouth clear,  oropharynx clear, no postnasal drip  Neck: No JVD, some intermittent upper airway noise  Lungs: No use of accessory muscles, no wheezing or crackles  Cardiovascular: RRR, heart sounds normal, no murmur or gallops, no peripheral edema  Musculoskeletal: No deformities, no cyanosis or clubbing  Neuro: alert, non focal  Skin: Warm, no lesions or rash    Assessment & Plan:  Chronic cough Suspect that this is related to reflux, both standard  reflux due to his hiatal hernia but also more importantly 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  He has received swallowing precautions and instructions from speech therapy.  I will start him on omeprazole so that he can take this and review any improvements with Dr Evette Cristal at their follow-up.  I believe he needs pulmonary function testing to ensure no evidence of lower or upper airway obstruction.  We will perform a chest x-ray.  I will follow-up with him to review the results and also assess any benefits from the PPI.  Levy Pupa, MD, PhD 09/17/2017, 9:30 AM [facility] [phone] or if no answer [phone]

## 2017-09-17 NOTE — Patient Instructions (Signed)
Start omeprazole 40 mg once daily.  Take this medication 1 hour before or after eating. Agree with your swallowing precautions as outlined by speech therapy after your modified barium swallow.  In particular do not lay down within 30 minutes of eating. Chest x-ray today We will perform pulmonary function testing Follow with Dr Delton CoombesByrum next available after PFT

## 2017-10-26 ENCOUNTER — Ambulatory Visit (INDEPENDENT_AMBULATORY_CARE_PROVIDER_SITE_OTHER): Payer: BLUE CROSS/BLUE SHIELD | Admitting: Emergency Medicine

## 2017-10-26 ENCOUNTER — Encounter: Payer: Self-pay | Admitting: Emergency Medicine

## 2017-10-26 DIAGNOSIS — R05 Cough: Secondary | ICD-10-CM | POA: Diagnosis not present

## 2017-10-26 DIAGNOSIS — J4520 Mild intermittent asthma, uncomplicated: Secondary | ICD-10-CM | POA: Insufficient documentation

## 2017-10-26 DIAGNOSIS — R053 Chronic cough: Secondary | ICD-10-CM

## 2017-10-26 DIAGNOSIS — R059 Cough, unspecified: Secondary | ICD-10-CM

## 2017-10-26 LAB — PULMONARY FUNCTION TEST
DL/VA % pred: 121 %
DL/VA: 5.52 ml/min/mmHg/L
DLCO unc % pred: 97 %
DLCO unc: 29.46 ml/min/mmHg
FEF 25-75 Post: 2.09 L/sec
FEF 25-75 Pre: 1.38 L/sec
FEF2575-%Change-Post: 51 %
FEF2575-%Pred-Post: 72 %
FEF2575-%Pred-Pre: 48 %
FEV1-%Change-Post: 12 %
FEV1-%Pred-Post: 69 %
FEV1-%Pred-Pre: 61 %
FEV1-Post: 2.28 L
FEV1-Pre: 2.02 L
FEV1FVC-%Change-Post: 2 %
FEV1FVC-%Pred-Pre: 89 %
FEV6-%Change-Post: 9 %
FEV6-Post: 3.1 L
FEV6-Pre: 2.83 L
FEV6FVC-%Change-Post: 0 %
FVC-%Change-Post: 9 %
FVC-%Pred-Post: 75 %
FVC-%Pred-Pre: 68 %
FVC-Post: 3.12 L
FVC-Pre: 2.84 L
Post FEV1/FVC ratio: 73 %
Post FEV6/FVC ratio: 99 %
Pre FEV1/FVC ratio: 71 %
Pre FEV6/FVC Ratio: 99 %
RV % pred: 145 %
RV: 3.08 L
TLC % pred: 91 %
TLC: 6.11 L

## 2017-10-26 NOTE — Patient Instructions (Signed)
Stop your omeprazole.  We will arrange for you to see Dr. Evette CristalGanem again to follow-up your Zenker's diverticulum and the sensation of food / drink getting stuck Please keep albuterol available to use 2 puffs if needed for shortness of breath, wheezing, chest tightness. We will not start any every day scheduled inhalers right now. Please get the flu shot every year. Follow with Dr Delton CoombesByrum in 12 months or sooner if you have any problems

## 2017-10-26 NOTE — Assessment & Plan Note (Signed)
Confirmed on his pulmonary function testing today.  He only has intermittent symptoms, uses albuterol infrequently often when exposed to triggers.  We will continue this plan.  If his use increases then we may need to consider using schedule controller medication.

## 2017-10-26 NOTE — Assessment & Plan Note (Signed)
He is experiencing not so much cough but more consistent with food or drink getting stuck or refluxed from his Zenker's diverticulum.  We will stop his omeprazole.  He wonders if there is anything else he needs to be doing with regard to the diverticulum and the sensation of food being stuck.  I will send him back to discuss with Dr Evette CristalGanem

## 2017-10-26 NOTE — Progress Notes (Signed)
PFT done today. 

## 2017-10-26 NOTE — Progress Notes (Signed)
Subjective:    Patient ID: Timothy Moss, male    DOB: 08-Mar-1961, 57 y.o.   MRN: [id number]  Cough  Associated symptoms include shortness of breath and wheezing. Pertinent negatives include no ear pain, eye redness, fever, headaches, postnasal drip, rash, rhinorrhea or sore throat.   57 year old never smoker with a history of diabetes, hypertension, hyperlipidemia.  Referred today for evaluation of cough. He was well until end of December. He was traveling to [place], developed N/V/D after eating street food. He quickly developed cough. Still has some diarrhea, he reports stool studies negative, being evaluated by Dr Evette Cristal.  Also with some dysphagia, food getting stuck. The cough is occasionally productive. Continues to happen, worst at night. No allergy sx. He has intermittent GERD sx. Underwent esophagram 1/30 > hiatal hernia, Zenker's diverticulum. Than a MBS 2/5 >> confirmed the Zenker's with backflow, did not show overt aspiration.   ROV 10/26/17 --this is a follow-up visit for evaluation of cough.  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  I started him on omeprazole 40 mg once daily and he continued swallow precautions.  I reviewed a chest x-ray from 09/17/17 that was normal.  He underwent pulmonary function testing today that I have reviewed.  This shows evidence for moderately severe obstruction with a positive bronchodilator response.  His lung volumes show evidence for hyperinflation.  His diffusion capacity is normal. He still feels that something is stuck in his throat, minimal coughing these days. He still feels that food can get stuck. No reflux. Cough is better. He has some dyspnea with dusts and fumes. He has albuterol that he can use prn - uses about 2x a year.    Review of Systems  Constitutional: Negative for fever and unexpected weight change.  HENT: Negative for congestion, dental problem, ear pain,  nosebleeds, postnasal drip, rhinorrhea, sinus pressure, sneezing, sore throat and trouble swallowing.   Eyes: Negative for redness and itching.  Respiratory: Positive for cough, shortness of breath and wheezing. Negative for chest tightness.   Cardiovascular: Negative for palpitations and leg swelling.  Gastrointestinal: Positive for diarrhea. Negative for nausea and vomiting.  Genitourinary: Negative for dysuria.  Musculoskeletal: Negative for joint swelling.  Skin: Negative for rash.  Neurological: Negative for headaches.  Hematological: Does not bruise/bleed easily.  Psychiatric/Behavioral: Negative for dysphoric mood. The patient is not nervous/anxious.     Past Medical History:  Diagnosis Date  . CTS (carpal tunnel syndrome)   . Diabetes mellitus without complication (HCC)   . Hyperlipemia   . Hypertension      No family history on file.   Social History   Socioeconomic History  . Marital status: Married    Spouse name: Not on file  . Number of children: Not on file  . Years of education: Not on file  . Highest education level: Not on file  Social Needs  . Financial resource strain: Not on file  . Food insecurity - worry: Not on file  . Food insecurity - inability: Not on file  . Transportation needs - medical: Not on file  . Transportation needs - non-medical: Not on file  Occupational History  . Not on file  Tobacco Use  . Smoking status: Never Smoker  . Smokeless tobacco: Never Used  Substance and Sexual Activity  . Alcohol use: No  . Drug use: No  . Sexual activity: Not on file  Other Topics  Concern  . Not on file  Social History Narrative  . Not on file  works at [employer], no fumes or dusts. Originally from [place], [place] in [place], [place]    No Known Allergies   Outpatient Medications Prior to Visit  Medication Sig Dispense Refill  . fenofibrate (TRICOR) 145 MG tablet Take 145 mg by mouth daily.    Marland Kitchen. losartan (COZAAR) 50 MG tablet Take  50 mg by mouth daily.    . metFORMIN (GLUCOPHAGE) 500 MG tablet Take by mouth 2 (two) times daily with a meal.    . omeprazole (PRILOSEC) 40 MG capsule Take 1 capsule (40 mg total) by mouth daily. 30 capsule 5  . pioglitazone (ACTOS) 15 MG tablet Take 15 mg by mouth daily.    . pravastatin (PRAVACHOL) 40 MG tablet Take 40 mg by mouth daily.     No facility-administered medications prior to visit.         Objective:   Physical Exam Vitals:   10/26/17 1003 10/26/17 1008  BP:  130/76  Pulse:  (!) 101  SpO2:  98%  Weight: 185 lb (83.9 kg)   Height: 5\' 8"  (1.727 m)    Gen: Pleasant, well-nourished, in no distress,  normal affect  ENT: No lesions,  mouth clear,  oropharynx clear, no postnasal drip  Neck: No JVD, no stridor  Lungs: No use of accessory muscles, no wheezing or crackles  Cardiovascular: RRR, heart sounds normal, no murmur or gallops, no peripheral edema  Musculoskeletal: No deformities, no cyanosis or clubbing  Neuro: alert, non focal  Skin: Warm, no lesions or rash    Assessment & Plan:  Mild intermittent asthma Confirmed on his pulmonary function testing today.  He only has intermittent symptoms, uses albuterol infrequently often when exposed to triggers.  We will continue this plan.  If his use increases then we may need to consider using schedule controller medication.  Chronic cough He is experiencing not so much cough but more consistent with food or drink getting stuck or refluxed from his Zenker's diverticulum.  We will stop his omeprazole.  He wonders if there is anything else he needs to be doing with regard to the diverticulum and the sensation of food being stuck.  I will send him back to discuss with Dr Chrystine OilerGanem  Lydell Moga, MD, PhD 10/26/2017, 10:40 AM [facility] [phone] or if no answer [phone]

## 2017-11-03 DIAGNOSIS — Z8739 Personal history of other diseases of the musculoskeletal system and connective tissue: Secondary | ICD-10-CM | POA: Diagnosis not present

## 2017-11-03 DIAGNOSIS — Z0000 Encounter for general adult medical examination without abnormal findings: Secondary | ICD-10-CM | POA: Diagnosis not present

## 2017-11-03 DIAGNOSIS — Z125 Encounter for screening for malignant neoplasm of prostate: Secondary | ICD-10-CM | POA: Diagnosis not present

## 2017-11-03 DIAGNOSIS — M25512 Pain in left shoulder: Secondary | ICD-10-CM | POA: Diagnosis not present

## 2017-11-03 DIAGNOSIS — E1369 Other specified diabetes mellitus with other specified complication: Secondary | ICD-10-CM | POA: Diagnosis not present

## 2017-11-03 DIAGNOSIS — E1329 Other specified diabetes mellitus with other diabetic kidney complication: Secondary | ICD-10-CM | POA: Diagnosis not present

## 2017-11-03 DIAGNOSIS — E119 Type 2 diabetes mellitus without complications: Secondary | ICD-10-CM | POA: Diagnosis not present

## 2017-11-03 DIAGNOSIS — E782 Mixed hyperlipidemia: Secondary | ICD-10-CM | POA: Diagnosis not present

## 2017-11-08 DIAGNOSIS — R809 Proteinuria, unspecified: Secondary | ICD-10-CM | POA: Diagnosis not present

## 2017-11-11 DIAGNOSIS — K225 Diverticulum of esophagus, acquired: Secondary | ICD-10-CM | POA: Diagnosis not present

## 2017-11-23 DIAGNOSIS — K219 Gastro-esophageal reflux disease without esophagitis: Secondary | ICD-10-CM | POA: Insufficient documentation

## 2017-11-23 DIAGNOSIS — R0681 Apnea, not elsewhere classified: Secondary | ICD-10-CM | POA: Insufficient documentation

## 2017-11-23 DIAGNOSIS — J351 Hypertrophy of tonsils: Secondary | ICD-10-CM | POA: Insufficient documentation

## 2017-11-23 DIAGNOSIS — K225 Diverticulum of esophagus, acquired: Secondary | ICD-10-CM | POA: Diagnosis not present

## 2017-11-23 DIAGNOSIS — R0683 Snoring: Secondary | ICD-10-CM | POA: Insufficient documentation

## 2017-11-29 NOTE — H&P (Signed)
HPI:   Timothy Moss is a 57 y.o. male who presents as a consult Patient.   Referring Provider: Mickie HillierLittle, Kevin Lorne, MD  Chief complaint: Zenker diverticulum.  HPI: About a 1 year history of difficulty swallowing. Sometimes he gets choked on his food. Sometimes he feels as though his going down the wrong pipe. Sometimes he has regurgitation of food after eating. He had a recent barium swallow which revealed a Zenker's diverticulum. He had a dysphagiagram which revealed aspiration. He is not losing weight because of this. He does have a long history of heartburn. He drinks tea and coffee about 3 servings daily but in the past has drank a lot more than that. He is a chronic snorer and has friends who have told him that they thought he was dead while sleeping because he stopped breathing. He does get very tired during the day.  PMH/Meds/All/SocHx/FamHx/ROS:   Past Medical History:  Diagnosis Date  . Diabetes mellitus (HCC)  . High cholesterol  . Hypertension   Past Surgical History:  Procedure Laterality Date  . BONE SPUR / FRAGMENT REMOVAL  . HAND TENDON SURGERY  . shoulder surgery   No family history of bleeding disorders, wound healing problems or difficulty with anesthesia.   Social History   Social History  . Marital status: N/A  Spouse name: N/A  . Number of children: N/A  . Years of education: N/A   Occupational History  . Not on file.   Social History Main Topics  . Smoking status: Former Games developermoker  . Smokeless tobacco: Former Neurosurgeon[employer]  . Alcohol use Not on file  . Drug use: Unknown  . Sexual activity: Not on file   Other Topics Concern  . Not on file   Social History Narrative  . No narrative on file   Current Outpatient Prescriptions:  . fenofibrate (LOFIBRA) 160 MG tablet, Take 160 mg by mouth., Disp: , Rfl:  . losartan (COZAAR) 100 MG tablet, Take 100 mg by mouth., Disp: , Rfl:  . metFORMIN (GLUCOPHAGE) 500 MG tablet, Take 1,000 mg by mouth., Disp: , Rfl:  .  pioglitazone (ACTOS) 15 MG tablet, Take 15 mg by mouth., Disp: , Rfl:  . pravastatin (PRAVACHOL) 80 MG tablet, Take 80 mg by mouth., Disp: , Rfl:   A complete ROS was performed with pertinent positives/negatives noted in the HPI. The remainder of the ROS are negative.   Physical Exam:   Ht 1.727 m (5\' 8" )  Wt 83 kg (183 lb)  BMI 27.83 kg/m   General: Healthy and alert, in no distress, breathing easily. Normal affect. In a pleasant mood. Head: Normocephalic, atraumatic. No masses, or scars. Eyes: Pupils are equal, and reactive to light. Vision is grossly intact. No spontaneous or gaze nystagmus. Ears: Ear canals are clear. Tympanic membranes are intact, with normal landmarks and the middle ears are clear and healthy. Hearing: Grossly normal. Nose: Nasal cavities are clear with healthy mucosa, no polyps or exudate. Airways are patent. Face: No masses or scars, facial nerve function is symmetric. Oral Cavity: No mucosal abnormalities are noted. Tongue with normal mobility. Dentition appears healthy. He has a slightly recessed mandible. Oropharynx: Tonsils are symmetric, 3+ enlarged. There are no mucosal masses identified. Tongue base appears normal and healthy. Larynx/Hypopharynx: indirect exam reveals healthy, mobile vocal cords, without mucosal lesions in the hypopharynx or larynx. Chest: Deferred Neck: No palpable masses, no cervical adenopathy, no thyroid nodules or enlargement. Neuro: Cranial nerves II-XII with normal function. Balance: Normal gate. Other  findings: none.  Independent Review of Additional Tests or Records:   Procedures:  none  Impression & Plans:  Zenker diverticulum. We discussed the causes of this especially reflux. Recommend complete caffeine avoidance. Recommend consideration for endoscopic diverticulectomy. Risks and benefits were discussed in detail. All questions were answered.  Large tonsils, anatomic concern and historical concern for sleep apnea. We  will order a sleep study on him after we get this swallowing problem taken care of.

## 2017-12-09 ENCOUNTER — Other Ambulatory Visit: Payer: Self-pay

## 2017-12-09 ENCOUNTER — Encounter (HOSPITAL_COMMUNITY): Payer: Self-pay | Admitting: *Deleted

## 2017-12-09 NOTE — Progress Notes (Signed)
   12/09/17 0930  OBSTRUCTIVE SLEEP APNEA  Have you ever been diagnosed with sleep apnea through a sleep study? No  Do you snore loudly (loud enough to be heard through closed doors)?  1  Do you often feel tired, fatigued, or sleepy during the daytime (such as falling asleep during driving or talking to someone)? 0  Has anyone observed you stop breathing during your sleep? 1  Do you have, or are you being treated for high blood pressure? 1  BMI more than 35 kg/m2?  (assess dos)  Age > 50 (1-yes) 1  Neck circumference greater than:Male 16 inches or larger, Male 17inches or larger?  (assess dos)  Male Gender (Yes=1) 1  Obstructive Sleep Apnea Score 5

## 2017-12-09 NOTE — Progress Notes (Signed)
Pt denies SOB, chest pain, and being under the care of a cardiologist. Pt denies having a stress test, echo and cardiac cath. Pt stated that an EKG was performed by Dr. Catha Gosselin at [facility]; records requested. Pt denies recent labs. Pt made aware to take no medications DOS ( Metformin/ Actos). Pt made aware to check BG every 2 hours prior to arrival to hospital on DOS. Pt made aware to treat a BG < 70 with 4 ounces of cranberry juice, wait 15 minutes after intervention to recheck BG, if BG remains < 70, call Short Stay unit to speak with a nurse. Pt verbalized understanding of all pre-op instructions.

## 2017-12-10 ENCOUNTER — Ambulatory Visit (HOSPITAL_COMMUNITY): Payer: BLUE CROSS/BLUE SHIELD | Admitting: Anesthesiology

## 2017-12-10 ENCOUNTER — Inpatient Hospital Stay (HOSPITAL_COMMUNITY)
Admission: RE | Admit: 2017-12-10 | Discharge: 2017-12-14 | DRG: 392 | Disposition: A | Discharge status: Home Health | Payer: BLUE CROSS/BLUE SHIELD | Source: Ambulatory Visit | Attending: Otolaryngology | Admitting: Otolaryngology

## 2017-12-10 ENCOUNTER — Encounter (HOSPITAL_COMMUNITY): Payer: Self-pay | Admitting: *Deleted

## 2017-12-10 ENCOUNTER — Encounter (HOSPITAL_COMMUNITY)
Admission: RE | Disposition: A | Discharge status: Home Health | Payer: Self-pay | Source: Ambulatory Visit | Attending: Otolaryngology

## 2017-12-10 DIAGNOSIS — K225 Diverticulum of esophagus, acquired: Principal | ICD-10-CM | POA: Diagnosis present

## 2017-12-10 DIAGNOSIS — R1310 Dysphagia, unspecified: Secondary | ICD-10-CM | POA: Diagnosis not present

## 2017-12-10 DIAGNOSIS — R509 Fever, unspecified: Secondary | ICD-10-CM | POA: Diagnosis not present

## 2017-12-10 DIAGNOSIS — I10 Essential (primary) hypertension: Secondary | ICD-10-CM | POA: Diagnosis present

## 2017-12-10 DIAGNOSIS — E7800 Pure hypercholesterolemia, unspecified: Secondary | ICD-10-CM | POA: Diagnosis present

## 2017-12-10 DIAGNOSIS — Z4659 Encounter for fitting and adjustment of other gastrointestinal appliance and device: Secondary | ICD-10-CM

## 2017-12-10 DIAGNOSIS — Z4682 Encounter for fitting and adjustment of non-vascular catheter: Secondary | ICD-10-CM | POA: Diagnosis not present

## 2017-12-10 DIAGNOSIS — Z7984 Long term (current) use of oral hypoglycemic drugs: Secondary | ICD-10-CM | POA: Diagnosis not present

## 2017-12-10 DIAGNOSIS — R053 Chronic cough: Secondary | ICD-10-CM

## 2017-12-10 DIAGNOSIS — K219 Gastro-esophageal reflux disease without esophagitis: Secondary | ICD-10-CM | POA: Diagnosis present

## 2017-12-10 DIAGNOSIS — J4520 Mild intermittent asthma, uncomplicated: Secondary | ICD-10-CM

## 2017-12-10 DIAGNOSIS — K9189 Other postprocedural complications and disorders of digestive system: Secondary | ICD-10-CM | POA: Diagnosis not present

## 2017-12-10 DIAGNOSIS — E119 Type 2 diabetes mellitus without complications: Secondary | ICD-10-CM | POA: Diagnosis not present

## 2017-12-10 DIAGNOSIS — R05 Cough: Secondary | ICD-10-CM

## 2017-12-10 DIAGNOSIS — Z79899 Other long term (current) drug therapy: Secondary | ICD-10-CM | POA: Diagnosis not present

## 2017-12-10 DIAGNOSIS — Z87891 Personal history of nicotine dependence: Secondary | ICD-10-CM

## 2017-12-10 HISTORY — PX: ZENKER'S DIVERTICULECTOMY ENDOSCOPIC: SHX5224

## 2017-12-10 HISTORY — DX: Pure hypercholesterolemia, unspecified: E78.00

## 2017-12-10 HISTORY — DX: Diverticulum of esophagus, acquired: K22.5

## 2017-12-10 LAB — GLUCOSE, CAPILLARY
GLUCOSE-CAPILLARY: 107 mg/dL — AB (ref 65–99)
Glucose-Capillary: 105 mg/dL — ABNORMAL HIGH (ref 65–99)

## 2017-12-10 LAB — BASIC METABOLIC PANEL
ANION GAP: 10 (ref 5–15)
BUN: 22 mg/dL — ABNORMAL HIGH (ref 6–20)
CALCIUM: 9.5 mg/dL (ref 8.9–10.3)
CO2: 26 mmol/L (ref 22–32)
CREATININE: 1.26 mg/dL — AB (ref 0.61–1.24)
Chloride: 102 mmol/L (ref 101–111)
Glucose, Bld: 110 mg/dL — ABNORMAL HIGH (ref 65–99)
Potassium: 4.2 mmol/L (ref 3.5–5.1)
SODIUM: 138 mmol/L (ref 135–145)

## 2017-12-10 LAB — CBC
HCT: 42.6 % (ref 39.0–52.0)
HEMOGLOBIN: 13.7 g/dL (ref 13.0–17.0)
MCH: 30.2 pg (ref 26.0–34.0)
MCHC: 32.2 g/dL (ref 30.0–36.0)
MCV: 94 fL (ref 78.0–100.0)
PLATELETS: 331 10*3/uL (ref 150–400)
RBC: 4.53 MIL/uL (ref 4.22–5.81)
RDW: 13.3 % (ref 11.5–15.5)
WBC: 5.7 10*3/uL (ref 4.0–10.5)

## 2017-12-10 LAB — HEMOGLOBIN A1C
HEMOGLOBIN A1C: 6.3 % — AB (ref 4.8–5.6)
Mean Plasma Glucose: 134.11 mg/dL

## 2017-12-10 SURGERY — ZENKER'S DIVERTICULECTOMY ENDOSCOPIC
Anesthesia: General | Site: Throat

## 2017-12-10 MED ORDER — MEPERIDINE HCL 50 MG/ML IJ SOLN: 6.2500 mg | INTRAMUSCULAR | Status: DC | PRN

## 2017-12-10 MED ORDER — MIDAZOLAM HCL 5 MG/5ML IJ SOLN
INTRAMUSCULAR | Status: DC | PRN
  Administered 2017-12-10: 1 mg via INTRAVENOUS

## 2017-12-10 MED ORDER — FENTANYL CITRATE (PF) 100 MCG/2ML IJ SOLN
25.0000 ug | INTRAMUSCULAR | Status: DC | PRN
  Administered 2017-12-10: 50 ug via INTRAVENOUS

## 2017-12-10 MED ORDER — FENTANYL CITRATE (PF) 100 MCG/2ML IJ SOLN
INTRAMUSCULAR | Status: AC
  Filled 2017-12-10: qty 2

## 2017-12-10 MED ORDER — FENTANYL CITRATE (PF) 100 MCG/2ML IJ SOLN
INTRAMUSCULAR | Status: DC | PRN
  Administered 2017-12-10: 50 ug via INTRAVENOUS
  Administered 2017-12-10: 100 ug via INTRAVENOUS

## 2017-12-10 MED ORDER — PROMETHAZINE HCL 25 MG PO TABS: 25.0000 mg | ORAL_TABLET | Freq: Four times a day (QID) | ORAL | Status: DC | PRN

## 2017-12-10 MED ORDER — MIDAZOLAM HCL 2 MG/2ML IJ SOLN
INTRAMUSCULAR | Status: AC
  Filled 2017-12-10: qty 2

## 2017-12-10 MED ORDER — CLINDAMYCIN PHOSPHATE 600 MG/50ML IV SOLN
600.0000 mg | Freq: Four times a day (QID) | INTRAVENOUS | Status: DC
  Administered 2017-12-10 – 2017-12-14 (×16): 600 mg via INTRAVENOUS
  Filled 2017-12-10 (×20): qty 50

## 2017-12-10 MED ORDER — POTASSIUM CHLORIDE IN NACL 20-0.45 MEQ/L-% IV SOLN
INTRAVENOUS | Status: DC
  Administered 2017-12-10 – 2017-12-14 (×7): via INTRAVENOUS
  Filled 2017-12-10 (×7): qty 1000

## 2017-12-10 MED ORDER — LACTATED RINGERS IV SOLN
INTRAVENOUS | Status: DC | PRN
  Administered 2017-12-10 (×2): via INTRAVENOUS

## 2017-12-10 MED ORDER — MORPHINE SULFATE (PF) 4 MG/ML IV SOLN
2.0000 mg | INTRAVENOUS | Status: DC | PRN
  Administered 2017-12-10 – 2017-12-14 (×6): 2 mg via INTRAVENOUS
  Filled 2017-12-10 (×7): qty 1

## 2017-12-10 MED ORDER — STERILE WATER FOR IRRIGATION IR SOLN
Status: DC | PRN
  Administered 2017-12-10: 1000 mL

## 2017-12-10 MED ORDER — ONDANSETRON HCL 4 MG/2ML IJ SOLN
INTRAMUSCULAR | Status: DC | PRN
  Administered 2017-12-10: 4 mg via INTRAVENOUS

## 2017-12-10 MED ORDER — FENTANYL CITRATE (PF) 250 MCG/5ML IJ SOLN
INTRAMUSCULAR | Status: AC
  Filled 2017-12-10: qty 5

## 2017-12-10 MED ORDER — SUGAMMADEX SODIUM 200 MG/2ML IV SOLN
INTRAVENOUS | Status: DC | PRN
  Administered 2017-12-10: 164.2 mg via INTRAVENOUS

## 2017-12-10 MED ORDER — 0.9 % SODIUM CHLORIDE (POUR BTL) OPTIME
TOPICAL | Status: DC | PRN
  Administered 2017-12-10: 1000 mL

## 2017-12-10 MED ORDER — MIDAZOLAM HCL 2 MG/2ML IJ SOLN: 0.5000 mg | Freq: Once | INTRAMUSCULAR | Status: DC | PRN

## 2017-12-10 MED ORDER — PROMETHAZINE HCL 25 MG/ML IJ SOLN: 6.2500 mg | INTRAMUSCULAR | Status: DC | PRN

## 2017-12-10 MED ORDER — ROCURONIUM BROMIDE 100 MG/10ML IV SOLN
INTRAVENOUS | Status: DC | PRN
Start: 2017-12-10 — End: 2017-12-10
  Administered 2017-12-10: 50 mg via INTRAVENOUS

## 2017-12-10 MED ORDER — LACTATED RINGERS IV SOLN: INTRAVENOUS | Status: DC

## 2017-12-10 MED ORDER — DEXAMETHASONE SODIUM PHOSPHATE 10 MG/ML IJ SOLN
INTRAMUSCULAR | Status: DC | PRN
  Administered 2017-12-10: 10 mg via INTRAVENOUS

## 2017-12-10 MED ORDER — PROPOFOL 10 MG/ML IV BOLUS
INTRAVENOUS | Status: DC | PRN
  Administered 2017-12-10: 200 mg via INTRAVENOUS

## 2017-12-10 MED ORDER — PROPOFOL 10 MG/ML IV BOLUS
INTRAVENOUS | Status: AC
  Filled 2017-12-10: qty 20

## 2017-12-10 MED ORDER — PROMETHAZINE HCL 25 MG RE SUPP
25.0000 mg | Freq: Four times a day (QID) | RECTAL | Status: DC | PRN
  Filled 2017-12-10: qty 1

## 2017-12-10 MED ORDER — LIDOCAINE HCL (CARDIAC) PF 100 MG/5ML IV SOSY
PREFILLED_SYRINGE | INTRAVENOUS | Status: DC | PRN
  Administered 2017-12-10: 30 mg via INTRAVENOUS

## 2017-12-10 SURGICAL SUPPLY — 21 items
CATH ROBINSON RED A/P 18FR (CATHETERS) ×2 IMPLANT
COVER BACK TABLE 60X90IN (DRAPES) ×2 IMPLANT
COVER MAYO STAND STRL (DRAPES) ×2 IMPLANT
COVER SURGICAL LIGHT HANDLE (MISCELLANEOUS) ×2 IMPLANT
CUTTER FLEX LINEAR 45M (STAPLE) ×2 IMPLANT
DRAPE HALF SHEET 40X57 (DRAPES) ×2 IMPLANT
GAUZE SPONGE 4X4 12PLY STRL (GAUZE/BANDAGES/DRESSINGS) IMPLANT
GAUZE SPONGE 4X4 16PLY XRAY LF (GAUZE/BANDAGES/DRESSINGS) ×2 IMPLANT
GLOVE BIOGEL PI IND STRL 8 (GLOVE) IMPLANT
GLOVE BIOGEL PI INDICATOR 8 (GLOVE)
GLOVE ECLIPSE 7.5 STRL STRAW (GLOVE) ×2 IMPLANT
GUARD TEETH (MISCELLANEOUS) ×2 IMPLANT
KIT TURNOVER KIT B (KITS) ×2 IMPLANT
NS IRRIG 1000ML POUR BTL (IV SOLUTION) ×2 IMPLANT
PAD ARMBOARD 7.5X6 YLW CONV (MISCELLANEOUS) ×4 IMPLANT
RELOAD STAPLE TA45 3.5 REG BLU (ENDOMECHANICALS) ×6 IMPLANT
SOLUTION ANTI FOG 6CC (MISCELLANEOUS) ×2 IMPLANT
STAPLE RELOAD 2.5MM WHITE (STAPLE) IMPLANT
SURGILUBE 2OZ TUBE FLIPTOP (MISCELLANEOUS) ×2 IMPLANT
TOWEL OR 17X24 6PK STRL BLUE (TOWEL DISPOSABLE) ×2 IMPLANT
TUBE CONNECTING 12X1/4 (SUCTIONS) ×2 IMPLANT

## 2017-12-10 NOTE — Anesthesia Preprocedure Evaluation (Addendum)
Anesthesia Evaluation  Patient identified by MRN, date of birth, ID band Patient awake    Reviewed: Allergy & Precautions, NPO status , Patient's Chart, lab work & pertinent test results  History of Anesthesia Complications Negative for: history of anesthetic complications  Airway Mallampati: II  TM Distance: >3 FB Neck ROM: Full    Dental  (+) Dental Advisory Given   Pulmonary neg pulmonary ROS,    breath sounds clear to auscultation       Cardiovascular hypertension, Pt. on medications (-) angina Rhythm:Regular Rate:Normal     Neuro/Psych negative neurological ROS     GI/Hepatic Neg liver ROS, GERD  Medicated and Controlled,  Endo/Other  diabetes (glu 107), Oral Hypoglycemic Agents  Renal/GU negative Renal ROS     Musculoskeletal   Abdominal   Peds  Hematology negative hematology ROS (+)   Anesthesia Other Findings   Reproductive/Obstetrics                            Anesthesia Physical Anesthesia Plan  ASA: II  Anesthesia Plan: General   Post-op Pain Management:    Induction: Intravenous  PONV Risk Score and Plan: 2 and Ondansetron and Dexamethasone  Airway Management Planned: Oral ETT  Additional Equipment:   Intra-op Plan:   Post-operative Plan: Extubation in OR  Informed Consent: I have reviewed the patients History and Physical, chart, labs and discussed the procedure including the risks, benefits and alternatives for the proposed anesthesia with the patient or authorized representative who has indicated his/her understanding and acceptance.   Dental advisory given  Plan Discussed with: CRNA and Surgeon  Anesthesia Plan Comments: (Plan routine monitors, GETA)        Anesthesia Quick Evaluation

## 2017-12-10 NOTE — Progress Notes (Signed)
Patient ID: Timothy Moss, male   DOB: 12/20/1960, 57 y.o.   MRN: [id number] Subjective: Some throat pain when he swallows and chest pain when he coughs. Handling secretions well. Up to bathroom and some walking.  Objective: Vital signs in last 24 hours: Temp:  [97.3 F (36.3 C)-98.5 F (36.9 C)] 97.6 F (36.4 C) (05/03 1255) Pulse Rate:  [62-72] 71 (05/03 1255) Resp:  [8-18] 12 (05/03 1255) BP: (126-151)/(78-94) 138/92 (05/03 1255) SpO2:  [94 %-100 %] 97 % (05/03 1255) Weight:  [82.1 kg (181 lb)] 82.1 kg (181 lb) (05/03 0849) Weight change:  Last BM Date: (pta)  Intake/Output from previous day: No intake/output data recorded. Intake/Output this shift: Total I/O In: 1153.8 [I.V.:1103.8; IV Piggyback:50] Out: -   PHYSICAL EXAM: Breathing clearly, no subcut air palpable.   Lab Results: Recent Labs    12/10/17 0918  WBC 5.7  HGB 13.7  HCT 42.6  PLT 331   BMET Recent Labs    12/10/17 0918  NA 138  K 4.2  CL 102  CO2 26  GLUCOSE 110*  BUN 22*  CREATININE 1.26*  CALCIUM 9.5    Studies/Results: No results found.  Medications: I have reviewed the patient's current medications.  Assessment/Plan: Stable post op. NPO until the am.  LOS: 0 days   [name] 12/10/2017, 5:20 PM

## 2017-12-10 NOTE — Op Note (Signed)
OPERATIVE REPORT  DATE OF SURGERY: 12/10/2017  PATIENT:  Timothy Moss,  57 y.o. male  PRE-OPERATIVE DIAGNOSIS:  ZENKERS DIVERTICULUM  POST-OPERATIVE DIAGNOSIS:  ZENKERS DIVERTICULUM  PROCEDURE:  Procedure(s): ZENKER'S DIVERTICULECTOMY ENDOSCOPIC  SURGEON:  Susy Frizzle, MD  ASSISTANTS: None   ANESTHESIA:   General   EBL: 10 ml  DRAINS: None  LOCAL MEDICATIONS USED:  None  SPECIMEN:  none  COUNTS:  Correct  PROCEDURE DETAILS: The patient was taken to the operating room and placed on the operating table in the supine position. Following induction of general endotracheal anesthesia, the table was turned 90 degrees and the patient was draped in a standard fashion for endoscopy.  A maxillary tooth protector was used throughout the case.  The Wierda-scope was inserted into the oral cavity and passed down towards the esophageal introitus.  The upper blade was passed into the esophagus and the lower blade into the diverticulum.  A nice cricopharyngeal band was identified.  Photograph was taken.  A nasogastric tube was passed into the esophagus to confirm placement and suctioned of gastric contents was performed.  The endoscopic stapler was then used, twice with the longer blade in the esophagus and a short blade in the pouch.  The muscle band was taken down.  There may have been a mucosal tear in the pouch.  No further treatment was performed in that area.  The scope was removed.  Patient was awakened extubated and transferred to recovery in stable condition.    PATIENT DISPOSITION:  To PACU, stable

## 2017-12-10 NOTE — Progress Notes (Signed)
Patient arrived to 6n22 A&Ox4, VSS, IV intact and infusing.  Patient denies pain at this time.  No family at bedside.  Patient oriented to room and equipment. Will continue to monitor.

## 2017-12-10 NOTE — Transfer of Care (Signed)
Immediate Anesthesia Transfer of Care Note  Patient: Timothy Moss  Procedure(s) Performed: Hans Eden DIVERTICULECTOMY ENDOSCOPIC (N/A Throat)  Patient Location: PACU  Anesthesia Type:General  Level of Consciousness: awake and alert   Airway & Oxygen Therapy: Patient Spontanous Breathing and Patient connected to nasal cannula oxygen  Post-op Assessment: Report given to RN and Post -op Vital signs reviewed and stable  Post vital signs: Reviewed and stable  Last Vitals:  Vitals Value Taken Time  BP 143/93 12/10/2017 11:33 AM  Temp 36.4 C 12/10/2017 11:33 AM  Pulse 64 12/10/2017 11:37 AM  Resp 10 12/10/2017 11:37 AM  SpO2 100 % 12/10/2017 11:37 AM  Vitals shown include unvalidated device data.  Last Pain:  Vitals:   12/10/17 0907  TempSrc:   PainSc: 0-No pain      Patients Stated Pain Goal: 4 (12/10/17 1610)  Complications: No apparent anesthesia complications

## 2017-12-10 NOTE — Anesthesia Procedure Notes (Signed)
Procedure Name: Intubation Date/Time: 12/10/2017 10:33 AM Performed by: Eligha Bridegroom, CRNA Pre-anesthesia Checklist: Patient identified, Emergency Drugs available, Suction available, Patient being monitored and Timeout performed Patient Re-evaluated:Patient Re-evaluated prior to induction Oxygen Delivery Method: Circle system utilized Preoxygenation: Pre-oxygenation with 100% oxygen Induction Type: IV induction Ventilation: Mask ventilation without difficulty and Oral airway inserted - appropriate to patient size Laryngoscope Size: Mac and 4 Grade View: Grade I Tube type: Oral Tube size: 7.5 mm Number of attempts: 1 Airway Equipment and Method: Stylet Placement Confirmation: ETT inserted through vocal cords under direct vision,  positive ETCO2 and breath sounds checked- equal and bilateral Secured at: 23 cm Tube secured with: Tape Dental Injury: Teeth and Oropharynx as per pre-operative assessment

## 2017-12-10 NOTE — Anesthesia Postprocedure Evaluation (Signed)
Anesthesia Post Note  Patient: Timothy Moss  Procedure(s) Performed: Hans Eden DIVERTICULECTOMY ENDOSCOPIC (N/A Throat)     Patient location during evaluation: PACU Anesthesia Type: General Level of consciousness: awake and alert, oriented and patient cooperative Pain management: pain level controlled Vital Signs Assessment: post-procedure vital signs reviewed and stable Respiratory status: spontaneous breathing, nonlabored ventilation and respiratory function stable Cardiovascular status: blood pressure returned to baseline and stable Postop Assessment: no apparent nausea or vomiting Anesthetic complications: no    Last Vitals:  Vitals:   12/10/17 1240 12/10/17 1243  BP:    Pulse: 67 62  Resp: 11 (!) 9  Temp: (!) 36.3 C   SpO2: 95% 94%    Last Pain:  Vitals:   12/10/17 1240  TempSrc:   PainSc: 4                  Mita Vallo,E. Aggie Douse

## 2017-12-10 NOTE — Interval H&P Note (Signed)
History and Physical Interval Note:  12/10/2017 10:10 AM  Timothy Moss  has presented today for surgery, with the diagnosis of ZENKERS DIVERTICULEM  The various methods of treatment have been discussed with the patient and family. After consideration of risks, benefits and other options for treatment, the patient has consented to  Procedure(s): ZENKER'S DIVERTICULECTOMY ENDOSCOPIC (N/A) as a surgical intervention .  The patient's history has been reviewed, patient examined, no change in status, stable for surgery.  I have reviewed the patient's chart and labs.  Questions were answered to the patient's satisfaction.     [name]

## 2017-12-11 ENCOUNTER — Observation Stay (HOSPITAL_COMMUNITY): Payer: BLUE CROSS/BLUE SHIELD

## 2017-12-11 ENCOUNTER — Encounter (HOSPITAL_COMMUNITY): Payer: Self-pay | Admitting: Otolaryngology

## 2017-12-11 DIAGNOSIS — R509 Fever, unspecified: Secondary | ICD-10-CM | POA: Diagnosis not present

## 2017-12-11 LAB — CBC
HEMATOCRIT: 40.7 % (ref 39.0–52.0)
HEMOGLOBIN: 13.6 g/dL (ref 13.0–17.0)
MCH: 30.9 pg (ref 26.0–34.0)
MCHC: 33.4 g/dL (ref 30.0–36.0)
MCV: 92.5 fL (ref 78.0–100.0)
Platelets: 296 10*3/uL (ref 150–400)
RBC: 4.4 MIL/uL (ref 4.22–5.81)
RDW: 13.5 % (ref 11.5–15.5)
WBC: 11.8 10*3/uL — ABNORMAL HIGH (ref 4.0–10.5)

## 2017-12-11 MED ORDER — ACETAMINOPHEN 650 MG RE SUPP
650.0000 mg | Freq: Four times a day (QID) | RECTAL | Status: DC | PRN
  Administered 2017-12-11: 650 mg via RECTAL
  Filled 2017-12-11: qty 1

## 2017-12-11 NOTE — Progress Notes (Signed)
Patient ID: Timothy Moss, male   DOB: 28-Nov-1960, 57 y.o.   MRN: [id number] Subjective: Has some nausea last night but feeling better this morning.  No trouble breathing.  Objective: Vital signs in last 24 hours: Temp:  [97.3 F (36.3 C)-98.5 F (36.9 C)] 97.4 F (36.3 C) (05/04 0515) Pulse Rate:  [62-92] 92 (05/04 0515) Resp:  [8-18] 16 (05/04 0515) BP: (113-151)/(63-94) 113/63 (05/04 0515) SpO2:  [94 %-100 %] 98 % (05/04 0515) Weight:  [82.1 kg (181 lb)] 82.1 kg (181 lb) (05/03 0849) Weight change:  Last BM Date: (pta)  Intake/Output from previous day: 05/03 0701 - 05/04 0700 In: 1153.8 [I.V.:1103.8; IV Piggyback:50] Out: -  Intake/Output this shift: No intake/output data recorded.  PHYSICAL EXAM: Neck looks excellent, no swelling erythema or crepitance.  He tried to swallow some cool water but it was too painful in his throat.  Lab Results: Recent Labs    12/10/17 0918  WBC 5.7  HGB 13.7  HCT 42.6  PLT 331   BMET Recent Labs    12/10/17 0918  NA 138  K 4.2  CL 102  CO2 26  GLUCOSE 110*  BUN 22*  CREATININE 1.26*  CALCIUM 9.5    Studies/Results: No results found.  Medications: I have reviewed the patient's current medications.  Assessment/Plan: Stable postop.  Not able to take p.o. liquids here.  Continue IV fluid support until he is drinking.  LOS: 0 days   [name] 12/11/2017, 8:26 AM

## 2017-12-12 ENCOUNTER — Inpatient Hospital Stay (HOSPITAL_COMMUNITY): Payer: BLUE CROSS/BLUE SHIELD

## 2017-12-12 DIAGNOSIS — K9189 Other postprocedural complications and disorders of digestive system: Secondary | ICD-10-CM | POA: Diagnosis not present

## 2017-12-12 DIAGNOSIS — E119 Type 2 diabetes mellitus without complications: Secondary | ICD-10-CM | POA: Diagnosis present

## 2017-12-12 DIAGNOSIS — Z87891 Personal history of nicotine dependence: Secondary | ICD-10-CM | POA: Diagnosis not present

## 2017-12-12 DIAGNOSIS — I10 Essential (primary) hypertension: Secondary | ICD-10-CM | POA: Diagnosis present

## 2017-12-12 DIAGNOSIS — K219 Gastro-esophageal reflux disease without esophagitis: Secondary | ICD-10-CM | POA: Diagnosis present

## 2017-12-12 DIAGNOSIS — Z4682 Encounter for fitting and adjustment of non-vascular catheter: Secondary | ICD-10-CM | POA: Diagnosis not present

## 2017-12-12 DIAGNOSIS — K225 Diverticulum of esophagus, acquired: Secondary | ICD-10-CM | POA: Diagnosis present

## 2017-12-12 DIAGNOSIS — Z79899 Other long term (current) drug therapy: Secondary | ICD-10-CM | POA: Diagnosis not present

## 2017-12-12 DIAGNOSIS — E7800 Pure hypercholesterolemia, unspecified: Secondary | ICD-10-CM | POA: Diagnosis present

## 2017-12-12 DIAGNOSIS — R1310 Dysphagia, unspecified: Secondary | ICD-10-CM | POA: Diagnosis not present

## 2017-12-12 DIAGNOSIS — Z7984 Long term (current) use of oral hypoglycemic drugs: Secondary | ICD-10-CM | POA: Diagnosis not present

## 2017-12-12 MED ORDER — IOPAMIDOL (ISOVUE-300) INJECTION 61%
INTRAVENOUS | Status: AC
  Filled 2017-12-12: qty 150

## 2017-12-12 NOTE — Progress Notes (Signed)
Informed by nursing assistant that patient had temp of 102.7.  Pt. Sweating and his faced appeared flushed.  I have informed Dr. Pollyann Kennedy of above information.  New orders given.  Pt. Given Tylenol suppository 650 mg, CBC ordered, and PA and lateral chest x-ray ordered.  Pt. Appears in no distress at this time. Pt. Diet also changed to NPO per orders.    Will continue to monitor patient.

## 2017-12-12 NOTE — Progress Notes (Signed)
Temperature rechecked. Temp 99.7.  Pt. Resting in bed.  No complaints of pain at this time.

## 2017-12-12 NOTE — Progress Notes (Signed)
Patient ID: Timothy Moss, male   DOB: 1960-12-17, 57 y.o.   MRN: [id number] Subjective: He spiked a fever last night after eating a small amount of Jell-O.  Chest x-ray was negative for air or mediastinal widening.  No direct evidence of leak.  White blood cell count slightly elevated.  He feels much better this morning.  The pain in his throat is mostly better but he still has some pain in his lower chest area especially when he tries to swallow.  Objective: Vital signs in last 24 hours: Temp:  [98.7 F (37.1 C)-102.7 F (39.3 C)] 99.9 F (37.7 C) (05/05 0414) Pulse Rate:  [88-99] 99 (05/05 0414) Resp:  [15-16] 16 (05/05 0414) BP: (111-135)/(68-84) 135/84 (05/05 0414) SpO2:  [96 %-98 %] 98 % (05/05 0414) Weight change:  Last BM Date: (pta)  Intake/Output from previous day: No intake/output data recorded. Intake/Output this shift: No intake/output data recorded.  PHYSICAL EXAM: No neck swelling or erythema.  Voice is clear.  Breathing is clear.  Auscultation of the chest reveals no crackles and good breath sounds bilaterally.  Lab Results: Recent Labs    12/10/17 0918 12/11/17 2308  WBC 5.7 11.8*  HGB 13.7 13.6  HCT 42.6 40.7  PLT 331 296   BMET Recent Labs    12/10/17 0918  NA 138  K 4.2  CL 102  CO2 26  GLUCOSE 110*  BUN 22*  CREATININE 1.26*  CALCIUM 9.5    Studies/Results: Dg Chest 2 View  Result Date: 12/11/2017 CLINICAL DATA:  57 year old male with fever. EXAM: CHEST - 2 VIEW COMPARISON:  Chest radiograph dated 09/17/2017 FINDINGS: Minimal left lung base atelectatic changes. Infiltrate is less likely. Clinical correlation is recommended. No focal consolidation, pleural effusion, or pneumothorax. The cardiac silhouette is within normal limits. No acute osseous pathology. IMPRESSION: No active cardiopulmonary disease. Electronically Signed   By: Elgie Collard M.D.   On: 12/11/2017 23:51    Medications: I have reviewed the patient's current  medications.  Assessment/Plan: I am still concerned about a possible small leak.  We will keep him n.p.o. for now.  We will do a barium swallow and evaluate the integrity of the staple line.  LOS: 0 days   [name] 12/12/2017, 9:27 AM

## 2017-12-13 ENCOUNTER — Inpatient Hospital Stay (HOSPITAL_COMMUNITY): Payer: BLUE CROSS/BLUE SHIELD

## 2017-12-13 MED ORDER — LIDOCAINE VISCOUS 2 % MT SOLN
OROMUCOSAL | Status: AC
  Filled 2017-12-13: qty 15

## 2017-12-13 MED ORDER — RANITIDINE HCL 150 MG/10ML PO SYRP
150.0000 mg | ORAL_SOLUTION | Freq: Two times a day (BID) | ORAL | Status: DC
  Administered 2017-12-13: 150 mg
  Filled 2017-12-13 (×3): qty 10

## 2017-12-13 MED ORDER — JEVITY 1.5 CAL/FIBER PO LIQD
1000.0000 mL | ORAL | Status: DC
  Administered 2017-12-13: 1000 mL
  Filled 2017-12-13: qty 1000

## 2017-12-13 MED ORDER — LIDOCAINE VISCOUS 2 % MT SOLN
15.0000 mL | Freq: Once | OROMUCOSAL | Status: AC
  Administered 2017-12-13: 3 mL via OROMUCOSAL

## 2017-12-13 MED ORDER — IOPAMIDOL (ISOVUE-300) INJECTION 61%
INTRAVENOUS | Status: AC
  Filled 2017-12-13: qty 50

## 2017-12-13 MED ORDER — HYDROCODONE-ACETAMINOPHEN 7.5-325 MG/15ML PO SOLN
10.0000 mL | ORAL | Status: DC | PRN
  Administered 2017-12-13: 10 mL
  Filled 2017-12-13: qty 15

## 2017-12-13 MED ORDER — PRO-STAT SUGAR FREE PO LIQD
30.0000 mL | Freq: Every day | ORAL | Status: DC
  Administered 2017-12-13: 30 mL via ORAL
  Filled 2017-12-13 (×2): qty 30

## 2017-12-13 MED ORDER — IOPAMIDOL (ISOVUE-300) INJECTION 61%
15.0000 mL | Freq: Once | INTRAVENOUS | Status: AC | PRN
  Administered 2017-12-13: 15 mL via ORAL

## 2017-12-13 NOTE — Progress Notes (Signed)
Initial Nutrition Assessment  DOCUMENTATION CODES:   Not applicable  INTERVENTION:   -Initiate Jevity 1.5 @ 20 ml/hr via NGT and increase by 10 ml every 4 hours to goal rate of 50 ml/hr.   30 ml Prostat daily.    If no IVFS, recommend 140 ml free water flush 6 times daily (additional 840 ml)  Tube feeding regimen provides 1900 kcal (100% of needs), 92 grams of protein, and 912 ml of H2O. (Total free water: 1752 ml)  -If pt is discharged home on TF, consider nocturnal feedings. Recommend:  Initiate Jevity 1.5 @ 100 ml/hr via NGT over 12 hour period  30 ml Prostat daily.    -If no IVFS, recommend 140 ml free water flush every 6 hours  (additional 840 ml)  Tube feeding regimen provides 1900 kcal (100% of needs), 92 grams of protein, and 912 ml of H2O. (Total free water: 1752 ml)  NUTRITION DIAGNOSIS:   Inadequate oral intake related to altered GI function as evidenced by NPO status.  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 He had a dysphagiagram which revealed aspiration.   Pt admitted with Zenker's diverticulum.   RD pulled to pt chart, due to RD being notified by cortrak team that NGT was placed in IR and cortrak team subsequently bridled tube.   5/3- S/p Procedure(s): ZENKER'S DIVERTICULECTOMY ENDOSCOPIC  Tip of tube verified in ligament of Treitz per KUB.  Case discussed with RN, who confirms that pt had NGT placed for feeding. Pt underwent swallow study wheich revealed a small leak.   Spoke with pt at bedside, who reports good appetite PTA. He denies any weight loss. He shares that he had experienced dysphagia with liquids (mainly water) over the past year, but denied difficulty with solids- he would  consume carbonated fluids like gingerale without difficulty. He denies any weight loss.   Pt shares that he experienced a small fever and pain in his throat and lower chest area when consuming clear liquid diet. He was able to verbalize plan for TF with this RD. Discussed how pt would receive nutrition. Pt also verbalized plan to repeat swallow study later this week to reassess leak.   ADDENDUM (1605): Received page from RN; plan for ENT to review RD note for TF recommendations.   Labs reviewed: CBGS: 105.   NUTRITION - FOCUSED PHYSICAL EXAM:    Most Recent Value  Orbital Region  No depletion  Upper Arm Region  No depletion  Thoracic and Lumbar Region  No depletion  Buccal Region  No depletion  Temple Region  No depletion  Clavicle Bone Region  No depletion  Clavicle and Acromion Bone Region  No depletion  Scapular Bone Region  No depletion  Dorsal Hand  No depletion  Patellar Region  No depletion  Anterior Thigh Region  No depletion  Posterior Calf Region  No depletion  Edema (RD Assessment)  None  Hair  Reviewed  Eyes  Reviewed  Mouth  Reviewed  Skin  Reviewed  Nails  Reviewed       Diet Order:   Diet Order           Diet NPO time specified  Diet effective now  EDUCATION NEEDS:   Education needs have been addressed  Skin:  Skin Assessment: Skin Integrity Issues: Skin Integrity Issues:: Incisions Incisions: throat  Last BM:  PTA  Height:   Ht Readings from Last 1 Encounters:  12/10/17  (1.727 m)    Weight:   Wt Readings from Last 1 Encounters:  12/10/17 181 lb (82.1 kg)    Ideal Body Weight:  70 kg  BMI:  Body mass index is 27.52 kg/m.  Estimated Nutritional Needs:   Kcal:  1750-1950  Protein:  90-105 grams  Fluid:  1.7-1.9 L    Bostyn Bogie A. Mayford Knife, RD, LDN, CDE Pager: [phone] After hours Pager: [phone]

## 2017-12-13 NOTE — Progress Notes (Signed)
Patient ID: Timothy Moss, male   DOB: 01/30/1961, 57 y.o.   MRN: [id number] Subjective: No new complaints, pain continues to improve.  Swallowing study reveals a tiny leak.  Objective: Vital signs in last 24 hours: Temp:  [99.2 F (37.3 C)-100.9 F (38.3 C)] 100.9 F (38.3 C) (05/06 0431) Pulse Rate:  [96-109] 96 (05/06 0431) Resp:  [16] 16 (05/06 0431) BP: (114-133)/(82) 114/82 (05/06 0431) SpO2:  [96 %-98 %] 98 % (05/06 0431) Weight change:  Last BM Date: (pta)  Intake/Output from previous day: 05/05 0701 - 05/06 0700 In: 650 [I.V.:600; IV Piggyback:50] Out: 300 [Urine:300] Intake/Output this shift: No intake/output data recorded.  PHYSICAL EXAM: No neck swelling.  Voice is normal.  Lab Results: Recent Labs    12/10/17 0918 12/11/17 2308  WBC 5.7 11.8*  HGB 13.7 13.6  HCT 42.6 40.7  PLT 331 296   BMET Recent Labs    12/10/17 0918  NA 138  K 4.2  CL 102  CO2 26  GLUCOSE 110*  BUN 22*  CREATININE 1.26*  CALCIUM 9.5    Studies/Results: Dg Chest 2 View  Result Date: 12/11/2017 CLINICAL DATA:  58 year old male with fever. EXAM: CHEST - 2 VIEW COMPARISON:  Chest radiograph dated 09/17/2017 FINDINGS: Minimal left lung base atelectatic changes. Infiltrate is less likely. Clinical correlation is recommended. No focal consolidation, pleural effusion, or pneumothorax. The cardiac silhouette is within normal limits. No acute osseous pathology. IMPRESSION: No active cardiopulmonary disease. Electronically Signed   By: Elgie Collard M.D.   On: 12/11/2017 23:51   Dg Esophagus W/water Sol Cm  Result Date: 12/12/2017 CLINICAL DATA:  Status post repair of Zenker's diverticulum 2 days prior. Persistent pain with swallowing. Clinical concern for perforation. EXAM: ESOPHOGRAM/BARIUM SWALLOW TECHNIQUE: Single contrast examination was performed using water soluble contrast. FLUOROSCOPY TIME:  Fluoroscopy Time:  1 minutes 6 seconds Radiation Exposure Index (if provided by the  fluoroscopic device): 145.3 mGy Number of Acquired Spot Images: 56 COMPARISON:  09/08/2017 esophagram. FINDINGS: There is a leak identified at the surgical repair site at the posterior junction of the hypopharynx and cervical esophagus, with a small to moderate contained retroesophageal curvilinear collection extending inferiorly into the upper mediastinum. There was a small amount of tracheal aspiration, which prompted a cough reflex in the patient. IMPRESSION: 1. Leak identified at the surgical repair site at the posterior junction of the hypopharynx and cervical esophagus, with small to moderate contained curvilinear collection extending inferiorly into the upper mediastinum. 2. Small amount of tracheal aspiration, which prompted a cough reflex in the patient. The patient returned to the inpatient floor with no evidence of respiratory distress. These results were called by telephone at the time of interpretation on 12/12/2017 at 12:55 pm to Dr. [name] , who verbally acknowledged these results. Electronically Signed   By: Delbert Phenix M.D.   On: 12/12/2017 13:03    Medications: I have reviewed the patient's current medications.  Assessment/Plan: Postop Zenker's, tiny leak.  Maintain n.p.o.  Recommend IR placement of nasogastric feeding tube and then he can potentially go home with that.  We will repeat a swallowing study at the end of the week.  LOS: 1 day   [name] 12/13/2017, 8:00 AM

## 2017-12-13 NOTE — Progress Notes (Signed)
Cortrak Team Note:   Received phone call from radiology requesting bridle placement.  IR placed 10 french NG tube in LEFT nare and RD placed bridle NG retaining system. AMT bridle pro NG retaining system instruction booklet and pink pic for opening clip given to patient as patient may be discharged home.   Romelle Starcher MS, RD, LDN, CNSC [phone] Pager  [phone] Weekend/On-Call Pager

## 2017-12-14 MED ORDER — JEVITY 1.5 CAL PO LIQD: 1200.0000 mL | ORAL | 7 refills | Status: AC

## 2017-12-14 MED ORDER — PRO-STAT 64 PO LIQD: 30.0000 mL | Freq: Every day | ORAL | 0 refills | Status: AC

## 2017-12-14 NOTE — Progress Notes (Signed)
Patient ID: Timothy Moss, male   DOB: 14-Dec-1960, 57 y.o.   MRN: [id number] Subjective: Doing well, no complaints, minimal pain, he had some coughing last night after the NG tube was placed but otherwise has done well.  He is tolerating tube feeds well.  Objective: Vital signs in last 24 hours: Temp:  [97.8 F (36.6 C)-98.1 F (36.7 C)] 97.8 F (36.6 C) (05/07 0630) Moss Rate:  [79-83] 79 (05/07 0630) BP: (128-130)/(75-81) 130/75 (05/07 0630) SpO2:  [98 %-99 %] 98 % (05/07 0630) Weight change:  Last BM Date: (pta)  Intake/Output from previous day: 05/06 0701 - 05/07 0700 In: 2349 [I.V.:1765; NG/GT:384; IV Piggyback:200] Out: -  Intake/Output this shift: No intake/output data recorded.  PHYSICAL EXAM: Breathing is clear.  Voice normal.  NG tube in place.  No neck swelling or erythema.  Lab Results: Recent Labs    12/11/17 2308  WBC 11.8*  HGB 13.6  HCT 40.7  PLT 296   BMET No results for input(s): NA, K, CL, CO2, GLUCOSE, BUN, CREATININE, CALCIUM in the last 72 hours.  Studies/Results: Dg Abd 1 View  Result Date: 12/13/2017 CLINICAL DATA:  Feeding tube placement EXAM: ABDOMEN - 1 VIEW COMPARISON:  CT abdomen and pelvis 04/09/2016 FLUOROSCOPY TIME:  1 minutes 42 seconds Dose: 24.1 mGy Images obtained: 1 FINDINGS: A small amount of contrast is been injected via a feeding tube. Tip of the feeding tube is at the ligament of Treitz. Contrast opacifies the second and third portions of the duodenum as well as the proximal jejunum at the ligament of Treitz. Small amount retained contrast in ascending and transverse colon. IMPRESSION: Tip of feeding tube is at the ligament of Treitz. Electronically Signed   By: Ulyses Southward M.D.   On: 12/13/2017 10:42   Dg Esophagus W/water Sol Cm  Result Date: 12/12/2017 CLINICAL DATA:  Status post repair of Zenker's diverticulum 2 days prior. Persistent pain with swallowing. Clinical concern for perforation. EXAM: ESOPHOGRAM/BARIUM SWALLOW  TECHNIQUE: Single contrast examination was performed using water soluble contrast. FLUOROSCOPY TIME:  Fluoroscopy Time:  1 minutes 6 seconds Radiation Exposure Index (if provided by the fluoroscopic device): 145.3 mGy Number of Acquired Spot Images: 56 COMPARISON:  09/08/2017 esophagram. FINDINGS: There is a leak identified at the surgical repair site at the posterior junction of the hypopharynx and cervical esophagus, with a small to moderate contained retroesophageal curvilinear collection extending inferiorly into the upper mediastinum. There was a small amount of tracheal aspiration, which prompted a cough reflex in the patient. IMPRESSION: 1. Leak identified at the surgical repair site at the posterior junction of the hypopharynx and cervical esophagus, with small to moderate contained curvilinear collection extending inferiorly into the upper mediastinum. 2. Small amount of tracheal aspiration, which prompted a cough reflex in the patient. The patient returned to the inpatient floor with no evidence of respiratory distress. These results were called by telephone at the time of interpretation on 12/12/2017 at 12:55 pm to Dr. [name] , who verbally acknowledged these results. Electronically Signed   By: Delbert Phenix M.D.   On: 12/12/2017 13:03    Medications: I have reviewed the patient's current medications.  Assessment/Plan: Stable, continue tube feeds and n.p.o.  May discharge home today.  We will follow-up Friday with barium swallow.  LOS: 2 days   [name] 12/14/2017, 8:51 AM

## 2017-12-14 NOTE — Progress Notes (Signed)
Patient discharge instructions reviewed with patient and wife. Patient and wife instructed on how to use the kangaroo pump. They verbalized understanding. Patient and wife aware of feeding schedule and equipment needed. Patient left unit in stable condition.

## 2017-12-14 NOTE — Discharge Instructions (Signed)
Do not eat or drink anything by mouth.  Use the tube feeds as directed.  Have a total of 1200 cc/day.  Have an additional 840 cc of water per day, more if you feel thirsty.  We will make arrangements for you to have a swallowing test Friday morning.  Call if there is any neck swelling, fever, trouble breathing or swallowing.

## 2017-12-14 NOTE — Discharge Summary (Signed)
Physician Discharge Summary  Patient ID: Timothy Moss MRN: [id number] DOB/AGE: [birth year] 58 y.o.  Admit date: 12/10/2017 Discharge date: 12/14/2017  Admission Diagnoses: Zenker diverticulum  Discharge Diagnoses:  Active Problems:   Zenker diverticulum   Zenker diverticula   Discharged Condition: fair  Hospital Course: Complicated by small leak at the repair site.  Consults: Interventional radiology for NG tube placement  Significant Diagnostic Studies: Barium swallow  Treatments: surgery: Endoscopic Zenker's diverticulum  Discharge Exam: Blood pressure 130/75, pulse 79, temperature 97.8 F (36.6 C), temperature source Oral, resp. rate 16, height  (1.727 m), weight 82.1 kg (181 lb), SpO2 98 %. PHYSICAL EXAM: Awake and alert, breathing well, voice normal.  No neck swelling or erythema.  Disposition: Discharge disposition: 01-Home or Self Care       Discharge Instructions    Diet NPO time specified   Complete by:  As directed    Increase activity slowly   Complete by:  As directed      Allergies as of 12/14/2017   No Known Allergies     Medication List    STOP taking these medications   fenofibrate 160 MG tablet   losartan 100 MG tablet Commonly known as:  COZAAR   metFORMIN 500 MG tablet Commonly known as:  GLUCOPHAGE   omeprazole 40 MG capsule Commonly known as:  PRILOSEC   pioglitazone 45 MG tablet Commonly known as:  ACTOS   pravastatin 40 MG tablet Commonly known as:  PRAVACHOL     TAKE these medications   feeding supplement (JEVITY 1.5 CAL) Liqd Place 1,200 mLs into feeding tube continuous for 5 days. You may run this at 50 cc/h for 24 hours/day. If you prefer you may run 100 cc/h 12 hours each day.  The total amount should be 1200 cc/day.   feeding supplement (PRO-STAT 64) Liqd Take 30 mLs by mouth daily for 7 days.      Follow-up Information    [name], MD. Schedule an appointment as soon as possible for a visit on 12/17/2017.    Specialty:  Otolaryngology Contact information: [address] [place] [address] [phone]           Signed: [name] 12/14/2017, 8:59 AM

## 2017-12-14 NOTE — Care Management Note (Signed)
Case Management Note  Patient Details  Name: Timothy Moss MRN: [id number] Date of Birth: 01-11-1961  Subjective/Objective:                    Action/Plan:  Discussed tube feeding at home with patient.   Nurse at hospital will begin teaching. Patient's wife will be at hospital at 1330 . [facility] will deliver tube feeding and pump to patient's home this evening. Confirmed face sheet information with patient.  Patient prefers 100 cc/hr for 12 hours a day. Expected Discharge Date:  12/14/17               Expected Discharge Plan:  Home w Home Health Services  In-House Referral:  Nutrition  Discharge planning Services  CM Consult  Post Acute Care Choice:  Durable Medical Equipment, Home Health Choice offered to:  Patient  DME Arranged:  Tube feeding, Tube feeding pump DME Agency:  [facility]  HH Arranged:  RN [facility] Agency:  [facility]  Status of Service:  Completed, signed off  If discussed at Long Length of Stay Meetings, dates discussed:    Additional Comments:  Kingsley Plan, RN 12/14/2017, 10:19 AM

## 2017-12-16 ENCOUNTER — Other Ambulatory Visit: Payer: Self-pay | Admitting: Otolaryngology

## 2017-12-16 DIAGNOSIS — K225 Diverticulum of esophagus, acquired: Secondary | ICD-10-CM

## 2017-12-17 ENCOUNTER — Ambulatory Visit
Admission: RE | Admit: 2017-12-17 | Discharge: 2017-12-17 | Disposition: A | Discharge status: Home / Self Care | Payer: BLUE CROSS/BLUE SHIELD | Source: Ambulatory Visit | Attending: Otolaryngology | Admitting: Otolaryngology

## 2017-12-17 ENCOUNTER — Other Ambulatory Visit: Payer: Self-pay | Admitting: Otolaryngology

## 2017-12-17 DIAGNOSIS — K225 Diverticulum of esophagus, acquired: Secondary | ICD-10-CM

## 2017-12-17 DIAGNOSIS — R1310 Dysphagia, unspecified: Secondary | ICD-10-CM | POA: Diagnosis not present

## 2017-12-21 ENCOUNTER — Other Ambulatory Visit: Payer: Self-pay | Admitting: Otolaryngology

## 2017-12-21 DIAGNOSIS — K225 Diverticulum of esophagus, acquired: Secondary | ICD-10-CM

## 2017-12-22 ENCOUNTER — Ambulatory Visit
Admission: RE | Admit: 2017-12-22 | Discharge: 2017-12-22 | Disposition: A | Discharge status: Home / Self Care | Payer: BLUE CROSS/BLUE SHIELD | Source: Ambulatory Visit | Attending: Otolaryngology | Admitting: Otolaryngology

## 2017-12-22 DIAGNOSIS — K225 Diverticulum of esophagus, acquired: Secondary | ICD-10-CM

## 2017-12-22 DIAGNOSIS — R1310 Dysphagia, unspecified: Secondary | ICD-10-CM | POA: Diagnosis not present

## 2018-02-03 ENCOUNTER — Other Ambulatory Visit (HOSPITAL_BASED_OUTPATIENT_CLINIC_OR_DEPARTMENT_OTHER): Payer: Self-pay

## 2018-02-03 DIAGNOSIS — G4730 Sleep apnea, unspecified: Secondary | ICD-10-CM

## 2018-02-03 DIAGNOSIS — R0683 Snoring: Secondary | ICD-10-CM

## 2018-02-04 DIAGNOSIS — J209 Acute bronchitis, unspecified: Secondary | ICD-10-CM | POA: Diagnosis not present

## 2018-02-18 DIAGNOSIS — H903 Sensorineural hearing loss, bilateral: Secondary | ICD-10-CM | POA: Diagnosis not present

## 2018-02-18 DIAGNOSIS — K219 Gastro-esophageal reflux disease without esophagitis: Secondary | ICD-10-CM | POA: Diagnosis not present

## 2018-02-18 DIAGNOSIS — H9113 Presbycusis, bilateral: Secondary | ICD-10-CM | POA: Insufficient documentation

## 2018-02-25 DIAGNOSIS — M545 Low back pain: Secondary | ICD-10-CM | POA: Diagnosis not present

## 2018-03-08 ENCOUNTER — Ambulatory Visit (HOSPITAL_BASED_OUTPATIENT_CLINIC_OR_DEPARTMENT_OTHER): Payer: BLUE CROSS/BLUE SHIELD | Attending: Otolaryngology | Admitting: Internal Medicine

## 2018-03-08 DIAGNOSIS — G4733 Obstructive sleep apnea (adult) (pediatric): Secondary | ICD-10-CM | POA: Insufficient documentation

## 2018-03-08 DIAGNOSIS — R0683 Snoring: Secondary | ICD-10-CM

## 2018-03-08 DIAGNOSIS — G4730 Sleep apnea, unspecified: Secondary | ICD-10-CM

## 2018-03-12 DIAGNOSIS — G4733 Obstructive sleep apnea (adult) (pediatric): Secondary | ICD-10-CM

## 2018-03-12 NOTE — Procedures (Signed)
    Patient Name: Timothy Moss, [name] Study Date: 03/08/2018 Gender: Male D.O.B: 11-09-1960 Age (years): 56 Referring Provider: Serena ColonelJefry Rosen Height (inches): 68 Interpreting Physician: Jetty Duhamellinton Irvin Bastin MD, ABSM Weight (lbs): 183 RPSGT: [place], [place] BMI: 28 MRN: [id number] Neck Size: 17.50  CLINICAL INFORMATION Sleep Study Type: HST  Indication for sleep study: Snoring  Epworth Sleepiness Score: 4  SLEEP STUDY TECHNIQUE A multi-channel overnight portable sleep study was performed. The channels recorded were: nasal airflow, thoracic respiratory movement, and oxygen saturation with a pulse oximetry. Snoring was also monitored.  MEDICATIONS Patient self administered medications include: none reported.  SLEEP ARCHITECTURE Patient was studied for 457.9 minutes. The sleep efficiency was 100.0 % and the patient was supine for 93.7%. The arousal index was 0.0 per hour.  RESPIRATORY PARAMETERS The overall AHI was 53.9 per hour, with a central apnea index of 0.0 per hour.  The oxygen nadir was 84% during sleep.  CARDIAC DATA Mean heart rate during sleep was 67.0 bpm.  IMPRESSIONS - Severe obstructive sleep apnea occurred during this study (AHI = 53.9/h). - No significant central sleep apnea occurred during this study (CAI = 0.0/h). - Oxygen desaturation was noted during this study (Min O2 = 84%). - Patient snored.  DIAGNOSIS - Obstructive Sleep Apnea (327.23 [G47.33 ICD-10])  RECOMMENDATIONS - Suggest CPAP titration sleep study or DME autopap. Other options would be based on clinical judgment. - Be careful with alcohol, sedatives and other CNS depressants that may worsen sleep apnea and disrupt normal sleep architecture. - Sleep hygiene should be reviewed to assess factors that may improve sleep quality. - Weight management and regular exercise should be initiated or continued.  [Electronically signed] 03/12/2018 10:20 AM  Jetty Duhamellinton Sherise Geerdes MD, ABSM Diplomate, American Board of  Sleep Medicine   NPI: [id number]                         Jetty Duhamellinton Brie Eppard Diplomate, American Board of Sleep Medicine  ELECTRONICALLY SIGNED ON:  03/12/2018, 10:18 AM [facility] PH: [phone]   FX: [phone] ACCREDITED BY THE AMERICAN ACADEMY OF SLEEP MEDICINE

## 2018-04-22 DIAGNOSIS — E1369 Other specified diabetes mellitus with other specified complication: Secondary | ICD-10-CM | POA: Diagnosis not present

## 2018-04-22 DIAGNOSIS — R21 Rash and other nonspecific skin eruption: Secondary | ICD-10-CM | POA: Diagnosis not present

## 2018-05-03 DIAGNOSIS — R1013 Epigastric pain: Secondary | ICD-10-CM | POA: Diagnosis not present

## 2018-06-27 IMAGING — RF DG ESOPHAGUS
10 of 11 series · 14 of 24 positions shown · non-contrast
Comparison: None.

CLINICAL DATA: Esophageal dysphagia

EXAM:
ESOPHOGRAM / BARIUM SWALLOW / BARIUM TABLET STUDY
TECHNIQUE: Combined double contrast and single contrast examination performed
using effervescent crystals, thick barium liquid, and thin barium
liquid. The patient was observed with fluoroscopy swallowing a 13 mm
barium sulphate tablet.
FLUOROSCOPY TIME:  Fluoroscopy Time:  2 minutes and 0 seconds
Radiation Exposure Index (if provided by the fluoroscopic device):
64 mGy
Number of Acquired Spot Images: 0

[Series 1: sequence · 2 of 10 frames shown (1 of 6)]
[frame 2/10]
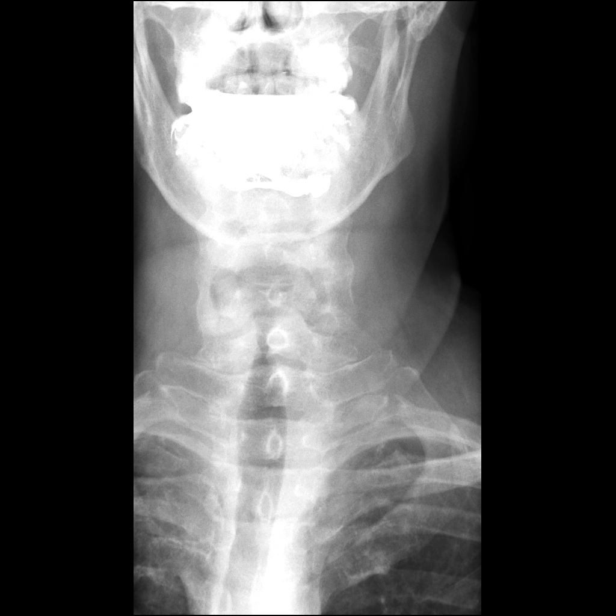
[frame 9/10]
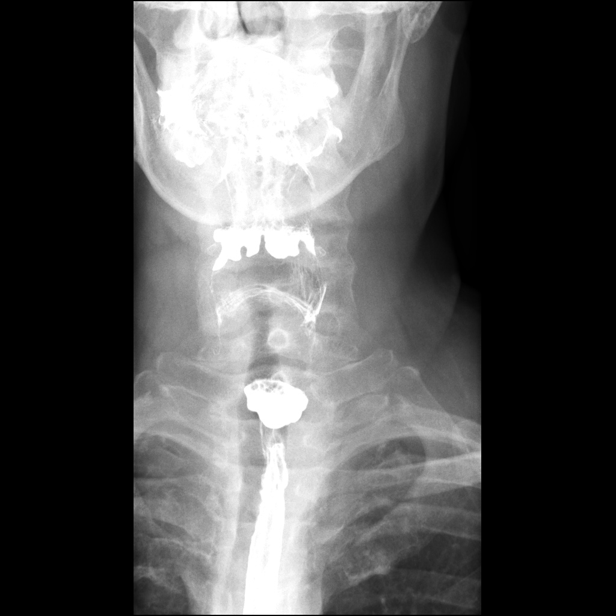

[Series 3: sequence · 1 of 16 frames shown (2 of 6)]
[frame 9/16]
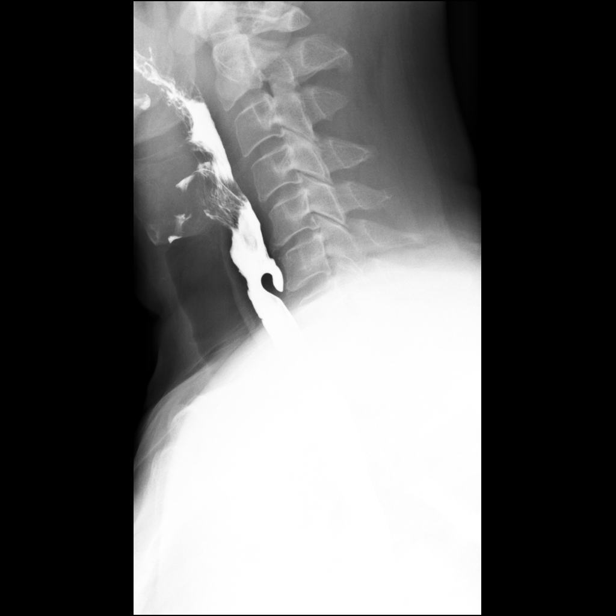

[Series 4: one shot · 2 of 3 slices shown (1 of 4)]
[im 2/3]
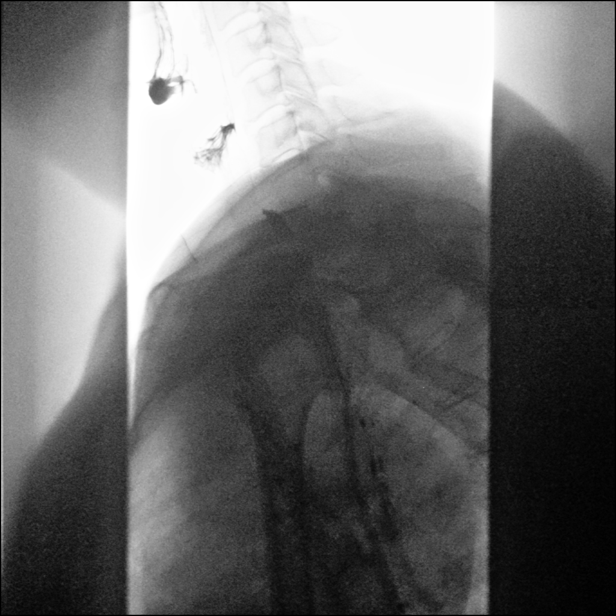
[im 3/3]
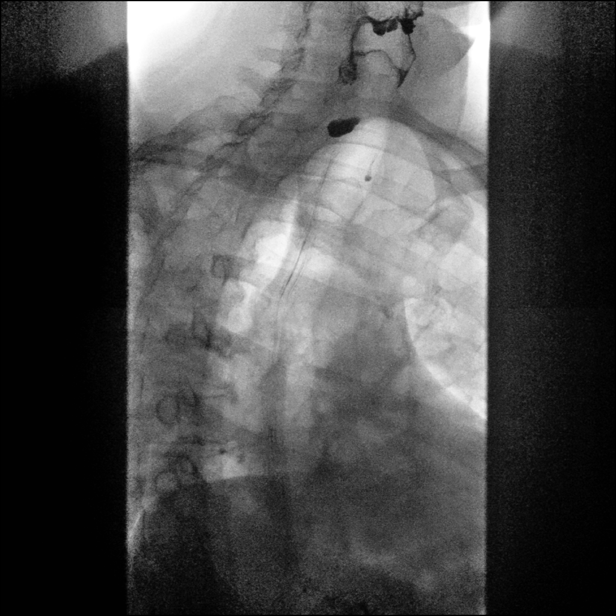

[Series 5: sequence · 1 of 18 frames shown (3 of 6)]
[frame 14/18]
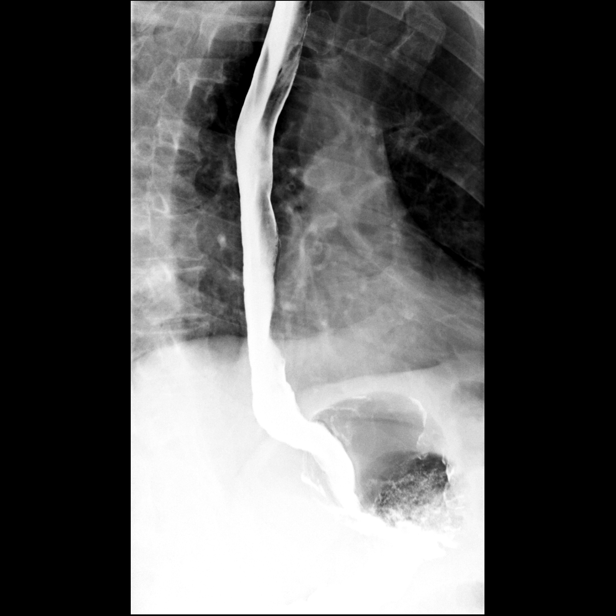

[Series 6: one shot · 1 of 2 slices shown (2 of 4)]
[im 2/2]
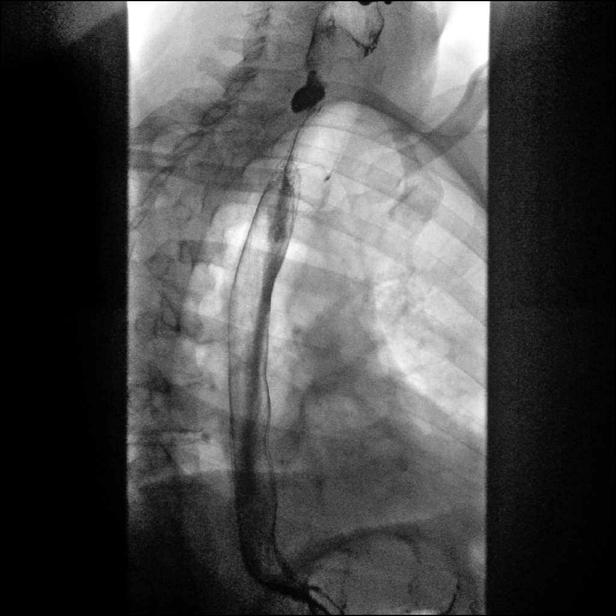

[Series 7: sequence · 1 of 20 frames shown (4 of 6)]
[frame 11/20]
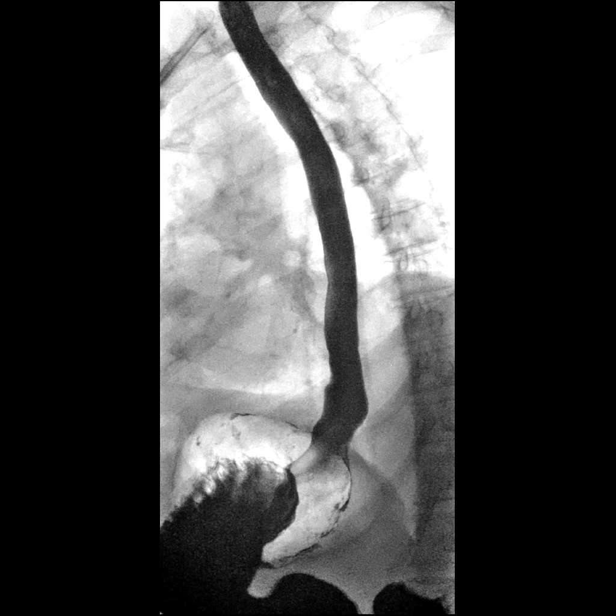

[Series 8: sequence · 1 of 12 frames shown (5 of 6)]
[frame 2/12]
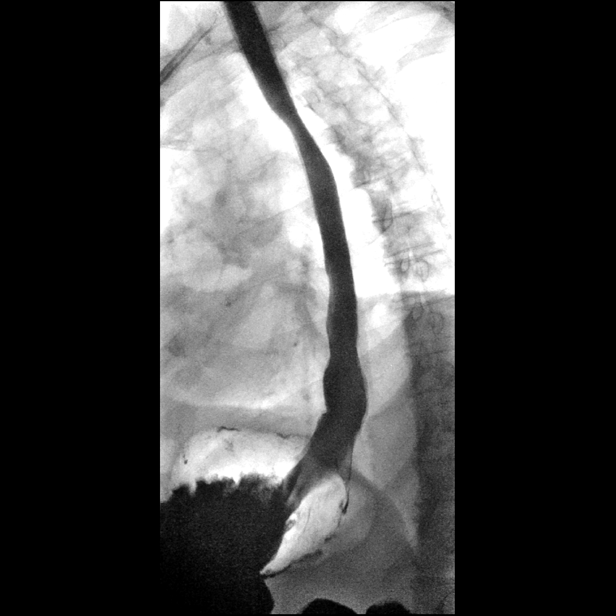

[Series 9: one shot · 1 of 3 slices shown (3 of 4)]
[im 1/3]
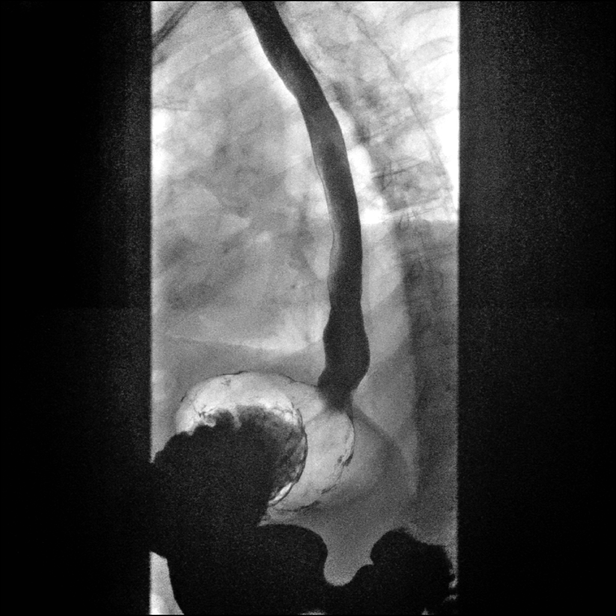

[Series 10: sequence · 2 of 7 frames shown (6 of 6)]
[frame 2/7]
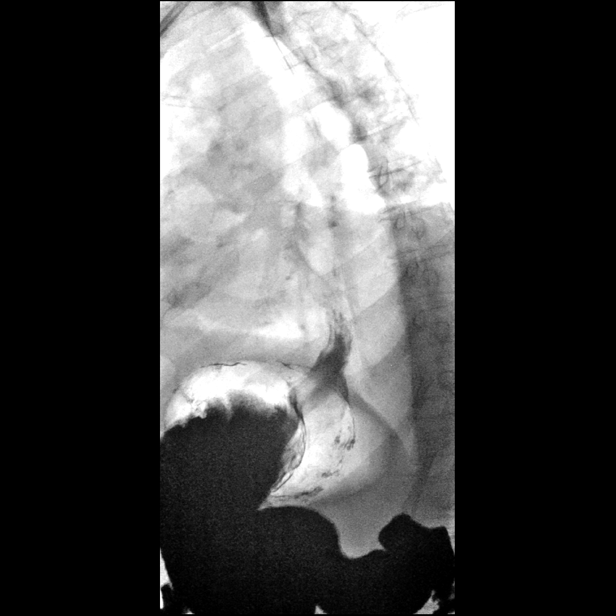
[frame 4/7]
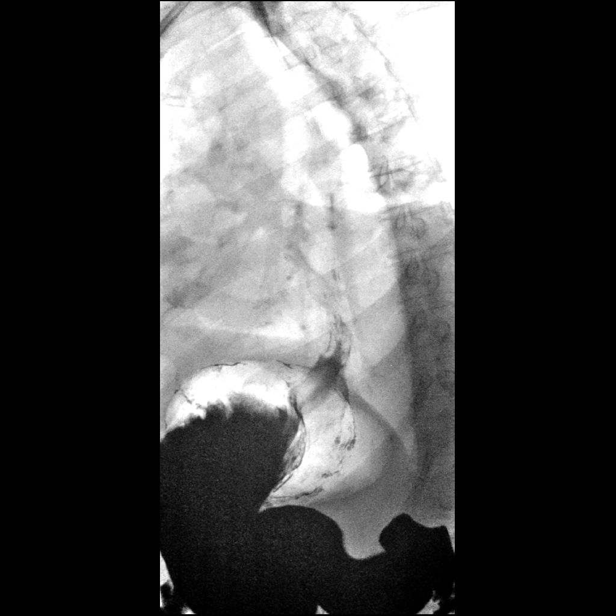

[Series 11: one shot · 2 of 5 slices shown (4 of 4)]
[im 2/5]
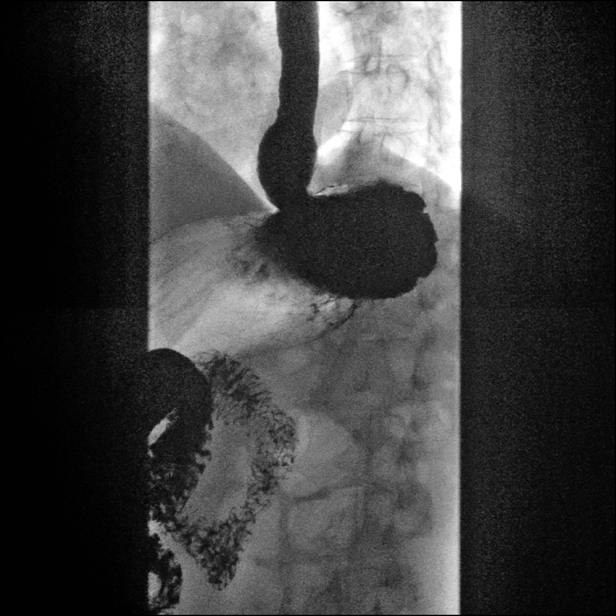
[im 5/5]
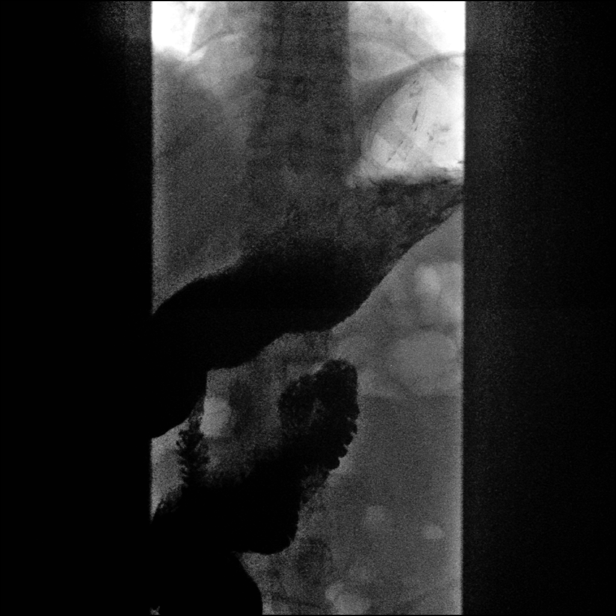

[14 of 24 positions shown; findings below may reference images not displayed]

FINDINGS: Initial barium swallows demonstrate normal pharyngeal motion with
swallowing. There is however evidence of laryngeal penetration and a
small amount of silent aspiration. Patient may need speech therapy
evaluation and swallowing function study.

There is Arianny Abrahim diverticulum.

Normal esophageal motility. No intrinsic or extrinsic mass lesions
are identified. There is a small sliding-type hiatal hernia and a
widely patent lower esophageal mucosal ring. The 13 mm barium pill
passed through this without difficulty.

Episodes of spontaneous and inducible GE reflux which were moderate.
IMPRESSION: 1. Episode of laryngeal penetration and small amount of silent
aspiration. Recommend consideration for speech therapy evaluation
and swallowing function study.
2. Enith Cleckler diverticulum.
3. Small sliding-type hiatal hernia and widely patent lower
esophageal mucosal ring.
4. Moderate GE reflux.

## 2018-07-06 DIAGNOSIS — R1013 Epigastric pain: Secondary | ICD-10-CM | POA: Diagnosis not present

## 2018-07-11 DIAGNOSIS — D485 Neoplasm of uncertain behavior of skin: Secondary | ICD-10-CM | POA: Diagnosis not present

## 2018-07-11 DIAGNOSIS — D2272 Melanocytic nevi of left lower limb, including hip: Secondary | ICD-10-CM | POA: Diagnosis not present

## 2018-07-11 DIAGNOSIS — Z1283 Encounter for screening for malignant neoplasm of skin: Secondary | ICD-10-CM | POA: Diagnosis not present

## 2018-07-11 DIAGNOSIS — D225 Melanocytic nevi of trunk: Secondary | ICD-10-CM | POA: Diagnosis not present

## 2018-08-04 DIAGNOSIS — R0600 Dyspnea, unspecified: Secondary | ICD-10-CM | POA: Diagnosis not present

## 2018-10-27 DIAGNOSIS — H1013 Acute atopic conjunctivitis, bilateral: Secondary | ICD-10-CM | POA: Diagnosis not present

## 2018-10-27 DIAGNOSIS — H04129 Dry eye syndrome of unspecified lacrimal gland: Secondary | ICD-10-CM | POA: Diagnosis not present

## 2018-12-14 DIAGNOSIS — Z0000 Encounter for general adult medical examination without abnormal findings: Secondary | ICD-10-CM | POA: Diagnosis not present

## 2018-12-14 DIAGNOSIS — I10 Essential (primary) hypertension: Secondary | ICD-10-CM | POA: Diagnosis not present

## 2018-12-14 DIAGNOSIS — Z8739 Personal history of other diseases of the musculoskeletal system and connective tissue: Secondary | ICD-10-CM | POA: Diagnosis not present

## 2018-12-14 DIAGNOSIS — E782 Mixed hyperlipidemia: Secondary | ICD-10-CM | POA: Diagnosis not present

## 2018-12-14 DIAGNOSIS — E1329 Other specified diabetes mellitus with other diabetic kidney complication: Secondary | ICD-10-CM | POA: Diagnosis not present

## 2019-01-18 DIAGNOSIS — I10 Essential (primary) hypertension: Secondary | ICD-10-CM | POA: Diagnosis not present

## 2019-01-18 DIAGNOSIS — R809 Proteinuria, unspecified: Secondary | ICD-10-CM | POA: Diagnosis not present

## 2019-01-18 DIAGNOSIS — R609 Edema, unspecified: Secondary | ICD-10-CM | POA: Diagnosis not present

## 2019-06-21 DIAGNOSIS — E1329 Other specified diabetes mellitus with other diabetic kidney complication: Secondary | ICD-10-CM | POA: Diagnosis not present

## 2019-06-21 DIAGNOSIS — Z7984 Long term (current) use of oral hypoglycemic drugs: Secondary | ICD-10-CM | POA: Diagnosis not present

## 2019-06-21 DIAGNOSIS — N189 Chronic kidney disease, unspecified: Secondary | ICD-10-CM | POA: Diagnosis not present

## 2019-07-28 ENCOUNTER — Ambulatory Visit: Payer: BC Managed Care – PPO | Attending: Internal Medicine

## 2019-07-28 DIAGNOSIS — Z20822 Contact with and (suspected) exposure to covid-19: Secondary | ICD-10-CM

## 2019-07-28 DIAGNOSIS — Z20828 Contact with and (suspected) exposure to other viral communicable diseases: Secondary | ICD-10-CM | POA: Diagnosis not present

## 2019-07-29 LAB — NOVEL CORONAVIRUS, NAA: SARS-CoV-2, NAA: NOT DETECTED

## 2019-09-15 ENCOUNTER — Ambulatory Visit (INDEPENDENT_AMBULATORY_CARE_PROVIDER_SITE_OTHER): Payer: 59 | Admitting: Podiatry

## 2019-09-15 ENCOUNTER — Other Ambulatory Visit: Payer: Self-pay | Admitting: Podiatry

## 2019-09-15 ENCOUNTER — Ambulatory Visit (INDEPENDENT_AMBULATORY_CARE_PROVIDER_SITE_OTHER): Payer: 59

## 2019-09-15 ENCOUNTER — Other Ambulatory Visit: Payer: Self-pay

## 2019-09-15 DIAGNOSIS — M7732 Calcaneal spur, left foot: Secondary | ICD-10-CM

## 2019-09-15 DIAGNOSIS — M722 Plantar fascial fibromatosis: Secondary | ICD-10-CM

## 2019-09-15 DIAGNOSIS — M79672 Pain in left foot: Secondary | ICD-10-CM

## 2019-09-15 DIAGNOSIS — M7731 Calcaneal spur, right foot: Secondary | ICD-10-CM | POA: Diagnosis not present

## 2019-09-15 DIAGNOSIS — M216X9 Other acquired deformities of unspecified foot: Secondary | ICD-10-CM | POA: Diagnosis not present

## 2019-09-15 DIAGNOSIS — M79671 Pain in right foot: Secondary | ICD-10-CM

## 2019-09-15 NOTE — Progress Notes (Signed)
  Subjective:  Patient ID: Timothy Moss, male    DOB: [birth year],  MRN: [id number]  Chief Complaint  Patient presents with  . Foot Pain    Pt states bilateral arch pain 6 weeks duration, no known injuries, pain on hard surfaces and burning/itching at night.  . Foot Pain    Pt states left plantar heel pain 1 week duration.   59 y.o. male presents with the above complaint. History confirmed with patient.  Objective:  Physical Exam: warm, good capillary refill, no trophic changes or ulcerative lesions, normal DP and PT pulses and normal sensory exam. Left Foot: tenderness to palpation medial calcaneal tuber, no pain with calcaneal squeeze, decreased ankle joint ROM and +Silverskiold test Right Foot: tenderness to palpation medial calcaneal tuber, no pain with calcaneal squeeze, decreased ankle joint ROM and +Silverskiold test  Radiographs: X-ray of both feet: no fracture, dislocation, swelling or degenerative changes noted and plantar and posterior heel spurring noted bilat   Assessment:   1. Plantar fasciitis   2. Heel spur, left   3. Heel spur, right   4. Equinus deformity of foot    Plan:  Patient was evaluated and treated and all questions answered.  Plantar Fasciitis -XR reviewed with patient -Educated patient on stretching and icing of the affected limb -Night splint dispensed -Injection delivered to the plantar fascia of both feet.  Procedure: Injection Tendon/Ligament Consent: Verbal consent obtained. Location: Bilateral plantar fascia at the glabrous junction; medial approach. Skin Prep: Alcohol. Injectate: 1 cc 0.5% marcaine plain, 1 cc dexamethasone phosphate, 0.5 cc kenalog 10. Disposition: Patient tolerated procedure well. Injection site dressed with a band-aid.  Return in about 3 weeks (around 10/06/2019) for Plantar fasciitis, Bilateral.

## 2019-09-15 NOTE — Patient Instructions (Signed)
Plantar Fasciitis Rehab Ask your health care provider which exercises are safe for you. Do exercises exactly as told by your health care provider and adjust them as directed. It is normal to feel mild stretching, pulling, tightness, or discomfort as you do these exercises. Stop right away if you feel sudden pain or your pain gets worse. Do not begin these exercises until told by your health care provider. Stretching and range-of-motion exercises These exercises warm up your muscles and joints and improve the movement and flexibility of your foot. These exercises also help to relieve pain. Plantar fascia stretch  1. Sit with your left / right leg crossed over your opposite knee. 2. Hold your heel with one hand with that thumb near your arch. With your other hand, hold your toes and gently pull them back toward the top of your foot. You should feel a stretch on the bottom of your toes or your foot (plantar fascia) or both. 3. Hold this stretch for__________ seconds. 4. Slowly release your toes and return to the starting position. Repeat __________ times. Complete this exercise __________ times a day. Gastrocnemius stretch, standing This exercise is also called a calf (gastroc) stretch. It stretches the muscles in the back of the upper calf. 1. Stand with your hands against a wall. 2. Extend your left / right leg behind you, and bend your front knee slightly. 3. Keeping your heels on the floor and your back knee straight, shift your weight toward the wall. Do not arch your back. You should feel a gentle stretch in your upper left / right calf. 4. Hold this position for __________ seconds. Repeat __________ times. Complete this exercise __________ times a day. Soleus stretch, standing This exercise is also called a calf (soleus) stretch. It stretches the muscles in the back of the lower calf. 1. Stand with your hands against a wall. 2. Extend your left / right leg behind you, and bend your front  knee slightly. 3. Keeping your heels on the floor, bend your back knee and shift your weight slightly over your back leg. You should feel a gentle stretch deep in your lower calf. 4. Hold this position for __________ seconds. Repeat __________ times. Complete this exercise __________ times a day. Gastroc and soleus stretch, standing step This exercise stretches the muscles in the back of the lower leg. These muscles are in the upper calf (gastrocnemius) and the lower calf (soleus). 1. Stand with the ball of your left / right foot on a step. The ball of your foot is on the walking surface, right under your toes. 2. Keep your other foot firmly on the same step. 3. Hold on to the wall or a railing for balance. 4. Slowly lift your other foot, allowing your body weight to press your left / right heel down over the edge of the step. You should feel a stretch in your left / right calf. 5. Hold this position for __________ seconds. 6. Return both feet to the step. 7. Repeat this exercise with a slight bend in your left / right knee. Repeat __________ times with your left / right knee straight and __________ times with your left / right knee bent. Complete this exercise __________ times a day. Balance exercise This exercise builds your balance and strength control of your arch to help take pressure off your plantar fascia. Single leg stand If this exercise is too easy, you can try it with your eyes closed or while standing on a pillow. 1.   Without shoes, stand near a railing or in a doorway. You may hold on to the railing or door frame as needed. 2. Stand on your left / right foot. Keep your big toe down on the floor and try to keep your arch lifted. Do not let your foot roll inward. 3. Hold this position for __________ seconds. Repeat __________ times. Complete this exercise __________ times a day. This information is not intended to replace advice given to you by your health care provider. Make sure  you discuss any questions you have with your health care provider. Document Revised: 11/17/2018 Document Reviewed: 05/25/2018 Elsevier Patient Education  2020 [employer]  

## 2019-10-06 ENCOUNTER — Telehealth: Payer: Self-pay | Admitting: *Deleted

## 2019-10-06 ENCOUNTER — Other Ambulatory Visit: Payer: Self-pay

## 2019-10-06 ENCOUNTER — Ambulatory Visit (INDEPENDENT_AMBULATORY_CARE_PROVIDER_SITE_OTHER): Payer: 59 | Admitting: Podiatry

## 2019-10-06 DIAGNOSIS — G5763 Lesion of plantar nerve, bilateral lower limbs: Secondary | ICD-10-CM | POA: Diagnosis not present

## 2019-10-06 DIAGNOSIS — M792 Neuralgia and neuritis, unspecified: Secondary | ICD-10-CM

## 2019-10-06 DIAGNOSIS — M722 Plantar fascial fibromatosis: Secondary | ICD-10-CM

## 2019-10-06 NOTE — Progress Notes (Signed)
  Subjective:  Patient ID: Timothy Moss, male    DOB: 06/01/1961,  MRN: [id number]  Chief Complaint  Patient presents with  . Plantar Fasciitis    Pt states bilateral plantar heel pain is still severe, has a pulling sensation in both heels. Pt states plantar fascial braces have been effective, injection is also effective but lasts only a few days.   59 y.o. male presents with the above complaint. History confirmed with patient. Also having pain at the inside of the heel that extends into the arch.  Objective:  Physical Exam: warm, good capillary refill, no trophic changes or ulcerative lesions, normal DP and PT pulses and normal sensory exam. Left Foot: tenderness to palpation medial calcaneal tuber, no pain with calcaneal squeeze, decreased ankle joint ROM and +Silverskiold test pain medial calcaneus Right Foot: tenderness to palpation medial calcaneal tuber, no pain with calcaneal squeeze, decreased ankle joint ROM and +Silverskiold test  pain medial calcaneus  Assessment:   1. Neuritis   2. Plantar fasciitis    Plan:  Patient was evaluated and treated and all questions answered.  Plantar Fasciitis -Injection delivered to the plantar fascia of both feet. -Additional injection to the Baxter's nerve -Dispense   Procedure: Injection Tendon/Ligament Consent: Verbal consent obtained. Location: Bilateral plantar fascia at the glabrous junction; medial approach. Skin Prep: Alcohol. Injectate: 1 cc 0.5% marcaine plain, 0.5 cc dexamethasone phosphate, 0.5 cc kenalog 10. Disposition: Patient tolerated procedure well. Injection site dressed with a band-aid.  Procedure: Neuroma Injection Location: Bilateral Baxter's nerve interspace Skin Prep: Alcohol. Injectate: 0.5 cc 0.5% marcaine plain, 0.5 cc dexamethasone phosphate. Disposition: Patient tolerated procedure well. Injection site dressed with a band-aid.  Return in about 3 weeks (around 10/27/2019) for Bilateral, Plantar fasciitis.

## 2019-10-06 NOTE — Telephone Encounter (Signed)
Pt states he has questions about what he should wear after today's visit.

## 2019-10-06 NOTE — Telephone Encounter (Signed)
I spoke with pt and told him there was a lot of noise in my office and I did not hear his question clearly, but I would listen better. Pt states at previous visit he was given a brace and today's visit he was given insert, should he wear both. I told pt he should wear the inserts only they would now support the area the braces had been supporting. Pt states understanding.

## 2019-10-18 ENCOUNTER — Ambulatory Visit: Payer: 59 | Attending: Internal Medicine

## 2019-10-18 DIAGNOSIS — Z20822 Contact with and (suspected) exposure to covid-19: Secondary | ICD-10-CM

## 2019-10-19 LAB — NOVEL CORONAVIRUS, NAA: SARS-CoV-2, NAA: NOT DETECTED

## 2019-11-06 ENCOUNTER — Ambulatory Visit: Payer: 59 | Attending: Internal Medicine

## 2019-11-06 DIAGNOSIS — Z20822 Contact with and (suspected) exposure to covid-19: Secondary | ICD-10-CM

## 2019-11-07 LAB — SARS-COV-2, NAA 2 DAY TAT

## 2019-11-07 LAB — NOVEL CORONAVIRUS, NAA: SARS-CoV-2, NAA: DETECTED — AB

## 2019-11-08 ENCOUNTER — Encounter: Payer: Self-pay | Admitting: Physician Assistant

## 2019-11-08 ENCOUNTER — Telehealth: Payer: Self-pay | Admitting: Physician Assistant

## 2019-11-08 DIAGNOSIS — E785 Hyperlipidemia, unspecified: Secondary | ICD-10-CM | POA: Insufficient documentation

## 2019-11-08 DIAGNOSIS — E119 Type 2 diabetes mellitus without complications: Secondary | ICD-10-CM | POA: Insufficient documentation

## 2019-11-08 DIAGNOSIS — E7800 Pure hypercholesterolemia, unspecified: Secondary | ICD-10-CM | POA: Insufficient documentation

## 2019-11-08 DIAGNOSIS — I10 Essential (primary) hypertension: Secondary | ICD-10-CM | POA: Insufficient documentation

## 2019-11-08 NOTE — Telephone Encounter (Signed)
Called to discuss with Timothy Moss about Covid symptoms and the use of bamlanivimab or casirivimab/imdevimab, a monoclonal antibody infusion for those with mild to moderate Covid symptoms and at a high risk of hospitalization.     Pt is qualified for this infusion at the [facility] infusion center due to co-morbid conditions and/or a member of an at-risk group, however declines infusion at this time. Symptoms tier reviewed as well as criteria for ending isolation.  Symptoms reviewed that would warrant ED/Hospital evaluation. Preventative practices reviewed. Patient verbalized understanding. Patient advised to call back if he decides that he does want to get infusion. Callback number to the infusion center given. Patient advised to go to Urgent care or ED with severe symptoms. Last date pt would be eligible for infusion is 11/14/19.    Patient Active Problem List   Diagnosis Date Noted  . Hypertension   . Hyperlipemia   . Hypercholesterolemia   . Diabetes mellitus without complication (HCC)   . Presbycusis of both ears 02/18/2018  . Zenker diverticula 12/12/2017  . Zenker diverticulum 12/10/2017  . Laryngopharyngeal reflux (LPR) 11/23/2017  . Snoring 11/23/2017  . Tonsillar hypertrophy 11/23/2017  . Witnessed episode of apnea 11/23/2017  . Mild intermittent asthma 10/26/2017  . Chronic cough 09/17/2017    Cline Crock PA-C

## 2019-11-09 ENCOUNTER — Ambulatory Visit: Payer: 59 | Admitting: Podiatry

## 2019-11-23 ENCOUNTER — Ambulatory Visit: Payer: 59 | Attending: Internal Medicine

## 2019-11-23 DIAGNOSIS — Z20822 Contact with and (suspected) exposure to covid-19: Secondary | ICD-10-CM

## 2019-11-24 LAB — SARS-COV-2, NAA 2 DAY TAT

## 2019-11-24 LAB — NOVEL CORONAVIRUS, NAA: SARS-CoV-2, NAA: NOT DETECTED

## 2020-03-01 ENCOUNTER — Ambulatory Visit (INDEPENDENT_AMBULATORY_CARE_PROVIDER_SITE_OTHER): Payer: 59 | Admitting: Podiatry

## 2020-03-01 ENCOUNTER — Encounter: Payer: Self-pay | Admitting: Podiatry

## 2020-03-01 ENCOUNTER — Other Ambulatory Visit: Payer: Self-pay

## 2020-03-01 DIAGNOSIS — M722 Plantar fascial fibromatosis: Secondary | ICD-10-CM | POA: Diagnosis not present

## 2020-03-01 NOTE — Progress Notes (Signed)
  Subjective:  Patient ID: Timothy Moss, male    DOB: 27-Nov-1960,  MRN: [id number]  Chief Complaint  Patient presents with  . Plantar Fasciitis    left heel pain is hurting again and the injection did help    59 y.o. male presents with the above complaint. History confirmed with patient. States the pain is worst when going from sitting to standing. Injection helped. Massing the area and using inserts help.  Objective:  Physical Exam: warm, good capillary refill, no trophic changes or ulcerative lesions, normal DP and PT pulses and normal sensory exam. Left Foot: tenderness to palpation medial calcaneal tuber, no pain with calcaneal squeeze, decreased ankle joint ROM and +Silverskiold test pain medial calcaneus  Assessment:   1. Plantar fasciitis    Plan:  Patient was evaluated and treated and all questions answered.  Plantar Fasciitis -Night splint dispensed -Injection delivered to the plantar fascia of the left foot.  Procedure: Injection Tendon/Ligament Consent: Verbal consent obtained. Location: Left plantar fascia at the glabrous junction; medial approach. Skin Prep: Alcohol. Injectate: 1 cc 0.5% marcaine plain, 1 cc dexamethasone phosphate, 0.5 cc kenalog 10. Disposition: Patient tolerated procedure well. Injection site dressed with a band-aid.  Return in about 4 weeks (around 03/29/2020) for Plantar fasciitis.

## 2020-03-29 ENCOUNTER — Ambulatory Visit: Payer: 59 | Admitting: Podiatry

## 2021-09-05 ENCOUNTER — Other Ambulatory Visit: Payer: Self-pay | Admitting: Family Medicine

## 2021-09-05 DIAGNOSIS — I10 Essential (primary) hypertension: Secondary | ICD-10-CM

## 2021-09-19 ENCOUNTER — Other Ambulatory Visit: Payer: Self-pay

## 2021-09-19 ENCOUNTER — Ambulatory Visit
Admission: RE | Admit: 2021-09-19 | Discharge: 2021-09-19 | Disposition: A | Discharge status: Home / Self Care | Payer: Self-pay | Source: Ambulatory Visit | Attending: Family Medicine | Admitting: Family Medicine

## 2021-09-19 DIAGNOSIS — I10 Essential (primary) hypertension: Secondary | ICD-10-CM

## 2022-10-09 DIAGNOSIS — E1122 Type 2 diabetes mellitus with diabetic chronic kidney disease: Secondary | ICD-10-CM | POA: Diagnosis not present

## 2022-11-25 ENCOUNTER — Ambulatory Visit: Payer: 59 | Admitting: Diagnostic Neuroimaging

## 2022-11-25 ENCOUNTER — Encounter: Payer: Self-pay | Admitting: Diagnostic Neuroimaging

## 2022-11-25 ENCOUNTER — Telehealth: Payer: Self-pay | Admitting: Diagnostic Neuroimaging

## 2022-11-25 VITALS — BP 129/81 | HR 72 | Ht 68.0 in | Wt 181.6 lb

## 2022-11-25 DIAGNOSIS — H532 Diplopia: Secondary | ICD-10-CM

## 2022-11-25 NOTE — Telephone Encounter (Signed)
[employer] sent to [facility] they obtain auth [id number] 

## 2022-11-25 NOTE — Patient Instructions (Signed)
  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

## 2022-11-25 NOTE — Progress Notes (Signed)
[facility]  PATIENT: Timothy Moss DOB: Jan 03, 1961  REFERRING CLINICIAN: Manning Charity, OD HISTORY FROM: patient  REASON FOR VISIT: new consult   HISTORICAL  CHIEF COMPLAINT:  Chief Complaint  Patient presents with   New Patient (Initial Visit)    Patient in room #6 and alone. Patient states he has double vision and his sugar level was high.     HISTORY OF PRESENT ILLNESS:   62 year old male here for evaluation of transient double vision.  In March 2024 had sudden onset double vision, horizontal, binocular, when he woke up.  He went to eye doctor for evaluation.  He was given an eye patch and referral to neurology.  Around that time his sugars had significant increase in A1c was greater than 10.  Since that time symptoms resolved within 2 to 3 weeks of onset.  Now patient back to baseline.  No recurrent or fluctuating symptoms.  History of hypertension, hyperlipidemia, diabetes.  On medical management.  No issues with headache, vision loss, numbness, weakness, slurred speech, swallowing difficulties.   REVIEW OF SYSTEMS: Full 14 system review of systems performed and negative with exception of: as per HPI.  ALLERGIES: No Known Allergies  HOME MEDICATIONS: Outpatient Medications Prior to Visit  Medication Sig Dispense Refill   albuterol (VENTOLIN HFA) 108 (90 Base) MCG/ACT inhaler      esomeprazole (NEXIUM) 40 MG capsule Take 40 mg by mouth daily.     fenofibrate 160 MG tablet Take 160 mg by mouth daily.     JARDIANCE 25 MG TABS tablet Take 25 mg by mouth daily.     MITIGARE 0.6 MG CAPS      pioglitazone (ACTOS) 45 MG tablet Take 45 mg by mouth daily.     pravastatin (PRAVACHOL) 80 MG tablet Take 80 mg by mouth daily.     sildenafil (REVATIO) 20 MG tablet SMARTSIG:1-5 Tablet(s) By Mouth Daily PRN     telmisartan (MICARDIS) 80 MG tablet Take 80 mg by mouth daily.     No facility-administered medications prior to visit.    PAST MEDICAL HISTORY: Past  Medical History:  Diagnosis Date   CTS (carpal tunnel syndrome)    Diabetes mellitus without complication    Hyperlipemia    Hypertension    Zenker's diverticulum     PAST SURGICAL HISTORY: Past Surgical History:  Procedure Laterality Date   CARPAL TUNNEL RELEASE Left 12/31/2014   Procedure: LEFT ENDOSCOPIC CARPAL TUNNEL RELEASE ;  Surgeon: Mack Hook, MD;  Location: [facility];  Service: Orthopedics;  Laterality: Left;   COLONOSCOPY     FOOT SURGERY     SHOULDER SURGERY     ZENKER'S DIVERTICULECTOMY ENDOSCOPIC N/A 12/10/2017   Procedure: ZENKER'S DIVERTICULECTOMY ENDOSCOPIC;  Surgeon: [name], MD;  Location: [facility] OR;  Service: ENT;  Laterality: N/A;    FAMILY HISTORY: History reviewed. No pertinent family history.  SOCIAL HISTORY: Social History   Socioeconomic History   Marital status: Married    Spouse name: Not on file   Number of children: Not on file   Years of education: Not on file   Highest education level: Not on file  Occupational History   Not on file  Tobacco Use   Smoking status: Never   Smokeless tobacco: Never  Vaping Use   Vaping Use: Never used  Substance and Sexual Activity   Alcohol use: No   Drug use: No   Sexual activity: Not on file  Other Topics Concern   Not  on file  Social History Narrative   Not on file   Social Determinants of Health   Financial Resource Strain: Not on file  Food Insecurity: Not on file  Transportation Needs: Not on file  Physical Activity: Not on file  Stress: Not on file  Social Connections: Not on file  Intimate Partner Violence: Not on file     PHYSICAL EXAM  GENERAL EXAM/CONSTITUTIONAL: Vitals:  Vitals:   11/25/22 1038  BP: 129/81  Pulse: 72  Weight: 181 lb 9.6 oz (82.4 kg)  Height:  (1.727 m)   Body mass index is 27.61 kg/m. Wt Readings from Last 3 Encounters:  11/25/22 181 lb 9.6 oz (82.4 kg)  03/08/18 183 lb (83 kg)  12/10/17 181 lb (82.1 kg)   Patient is in no  distress; well developed, nourished and groomed; neck is supple  CARDIOVASCULAR: Examination of carotid arteries is normal; no carotid bruits Regular rate and rhythm, no murmurs Examination of peripheral vascular system by observation and palpation is normal  EYES: Ophthalmoscopic exam of optic discs and posterior segments is normal; no papilledema or hemorrhages No results found.  MUSCULOSKELETAL: Gait, strength, tone, movements noted in Neurologic exam below  NEUROLOGIC: MENTAL STATUS:      No data to display         awake, alert, oriented to person, place and time recent and remote memory intact normal attention and concentration language fluent, comprehension intact, naming intact fund of knowledge appropriate  CRANIAL NERVE:  2nd - no papilledema on fundoscopic exam 2nd, 3rd, 4th, 6th - pupils equal and reactive to light, visual fields full to confrontation, extraocular muscles intact, no nystagmus 5th - facial sensation symmetric 7th - facial strength symmetric 8th - hearing intact 9th - palate elevates symmetrically, uvula midline 11th - shoulder shrug symmetric 12th - tongue protrusion midline  MOTOR:  normal bulk and tone, full strength in the BUE, BLE  SENSORY:  normal and symmetric to light touch, temperature, vibration  COORDINATION:  finger-nose-finger, fine finger movements normal  REFLEXES:  deep tendon reflexes present and symmetric  GAIT/STATION:  narrow based gait     DIAGNOSTIC DATA (LABS, IMAGING, TESTING) - I reviewed patient records, labs, notes, testing and imaging myself where available.  Lab Results  Component Value Date   WBC 11.8 (H) 12/11/2017   HGB 13.6 12/11/2017   HCT 40.7 12/11/2017   MCV 92.5 12/11/2017   PLT 296 12/11/2017      Component Value Date/Time   NA 138 12/10/2017 0918   K 4.2 12/10/2017 0918   CL 102 12/10/2017 0918   CO2 26 12/10/2017 0918   GLUCOSE 110 (H) 12/10/2017 0918   BUN 22 (H) 12/10/2017  0918   CREATININE 1.26 (H) 12/10/2017 0918   CALCIUM 9.5 12/10/2017 0918   GFRNONAA >60 12/10/2017 0918   GFRAA >60 12/10/2017 0918   No results found for: "CHOL", "HDL", "LDLCALC", "LDLDIRECT", "TRIG", "CHOLHDL" Lab Results  Component Value Date   HGBA1C 6.3 (H) 12/10/2017   No results found for: "VITAMINB12" No results found for: "TSH"    ASSESSMENT AND PLAN  62 y.o. year old male here with:  Dx:  1. 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  I reviewed images, labs, notes, records myself. I summarized findings and reviewed with patient, for this high risk condition (stroke evaluation) requiring high complexity decision making.    Suanne Marker, MD 11/25/2022, 11:24 AM Certified in Neurology, Neurophysiology and Neuroimaging  [facility] [address], [address] [place], [place] [address] [phone]

## 2022-11-26 LAB — TSH RFX ON ABNORMAL TO FREE T4: TSH: 1.72 u[IU]/mL (ref 0.450–4.500)

## 2022-11-27 LAB — ACHR ABS WITH REFLEX TO MUSK: AChR Binding Ab, Serum: <0.03 nmol/L (ref 0.00–0.24)

## 2022-11-27 LAB — MUSK ANTIBODIES

## 2022-12-14 ENCOUNTER — Encounter: Payer: Self-pay | Admitting: Diagnostic Neuroimaging

## 2022-12-18 ENCOUNTER — Other Ambulatory Visit: Payer: No Typology Code available for payment source

## 2022-12-22 DIAGNOSIS — M79644 Pain in right finger(s): Secondary | ICD-10-CM | POA: Diagnosis not present

## 2022-12-29 DIAGNOSIS — M79644 Pain in right finger(s): Secondary | ICD-10-CM | POA: Diagnosis not present

## 2023-02-16 DIAGNOSIS — I10 Essential (primary) hypertension: Secondary | ICD-10-CM | POA: Diagnosis not present

## 2023-02-16 DIAGNOSIS — E1122 Type 2 diabetes mellitus with diabetic chronic kidney disease: Secondary | ICD-10-CM | POA: Diagnosis not present

## 2023-02-16 DIAGNOSIS — M79643 Pain in unspecified hand: Secondary | ICD-10-CM | POA: Diagnosis not present

## 2023-02-16 DIAGNOSIS — E782 Mixed hyperlipidemia: Secondary | ICD-10-CM | POA: Diagnosis not present

## 2023-02-16 DIAGNOSIS — N1831 Chronic kidney disease, stage 3a: Secondary | ICD-10-CM | POA: Diagnosis not present

## 2023-03-16 DIAGNOSIS — M7711 Lateral epicondylitis, right elbow: Secondary | ICD-10-CM | POA: Diagnosis not present

## 2023-05-03 DIAGNOSIS — R062 Wheezing: Secondary | ICD-10-CM | POA: Diagnosis not present

## 2023-05-03 DIAGNOSIS — R051 Acute cough: Secondary | ICD-10-CM | POA: Diagnosis not present

## 2023-05-03 DIAGNOSIS — U071 COVID-19: Secondary | ICD-10-CM | POA: Diagnosis not present

## 2023-05-03 DIAGNOSIS — R509 Fever, unspecified: Secondary | ICD-10-CM | POA: Diagnosis not present

## 2023-05-03 DIAGNOSIS — J029 Acute pharyngitis, unspecified: Secondary | ICD-10-CM | POA: Diagnosis not present

## 2023-09-07 DIAGNOSIS — G629 Polyneuropathy, unspecified: Secondary | ICD-10-CM | POA: Diagnosis not present

## 2023-09-07 DIAGNOSIS — E782 Mixed hyperlipidemia: Secondary | ICD-10-CM | POA: Diagnosis not present

## 2023-09-07 DIAGNOSIS — J4520 Mild intermittent asthma, uncomplicated: Secondary | ICD-10-CM | POA: Diagnosis not present

## 2023-09-07 DIAGNOSIS — J309 Allergic rhinitis, unspecified: Secondary | ICD-10-CM | POA: Diagnosis not present

## 2023-09-07 DIAGNOSIS — R809 Proteinuria, unspecified: Secondary | ICD-10-CM | POA: Diagnosis not present

## 2023-09-07 DIAGNOSIS — N1831 Chronic kidney disease, stage 3a: Secondary | ICD-10-CM | POA: Diagnosis not present

## 2023-09-07 DIAGNOSIS — M109 Gout, unspecified: Secondary | ICD-10-CM | POA: Diagnosis not present

## 2023-09-07 DIAGNOSIS — E209 Hypoparathyroidism, unspecified: Secondary | ICD-10-CM | POA: Diagnosis not present

## 2023-09-07 DIAGNOSIS — K219 Gastro-esophageal reflux disease without esophagitis: Secondary | ICD-10-CM | POA: Diagnosis not present

## 2023-09-07 DIAGNOSIS — N529 Male erectile dysfunction, unspecified: Secondary | ICD-10-CM | POA: Diagnosis not present

## 2023-09-07 DIAGNOSIS — E1122 Type 2 diabetes mellitus with diabetic chronic kidney disease: Secondary | ICD-10-CM | POA: Diagnosis not present

## 2023-09-07 DIAGNOSIS — I10 Essential (primary) hypertension: Secondary | ICD-10-CM | POA: Diagnosis not present

## 2023-09-14 DIAGNOSIS — Z0000 Encounter for general adult medical examination without abnormal findings: Secondary | ICD-10-CM | POA: Diagnosis not present

## 2023-09-14 DIAGNOSIS — E1122 Type 2 diabetes mellitus with diabetic chronic kidney disease: Secondary | ICD-10-CM | POA: Diagnosis not present

## 2023-09-14 DIAGNOSIS — N1831 Chronic kidney disease, stage 3a: Secondary | ICD-10-CM | POA: Diagnosis not present

## 2023-09-14 DIAGNOSIS — I10 Essential (primary) hypertension: Secondary | ICD-10-CM | POA: Diagnosis not present

## 2023-09-14 DIAGNOSIS — E782 Mixed hyperlipidemia: Secondary | ICD-10-CM | POA: Diagnosis not present

## 2023-09-14 DIAGNOSIS — Z6827 Body mass index (BMI) 27.0-27.9, adult: Secondary | ICD-10-CM | POA: Diagnosis not present

## 2023-09-14 DIAGNOSIS — G629 Polyneuropathy, unspecified: Secondary | ICD-10-CM | POA: Diagnosis not present

## 2023-09-14 DIAGNOSIS — N529 Male erectile dysfunction, unspecified: Secondary | ICD-10-CM | POA: Diagnosis not present

## 2023-09-14 DIAGNOSIS — K219 Gastro-esophageal reflux disease without esophagitis: Secondary | ICD-10-CM | POA: Diagnosis not present

## 2023-11-02 DIAGNOSIS — M545 Low back pain, unspecified: Secondary | ICD-10-CM | POA: Diagnosis not present

## 2023-11-19 ENCOUNTER — Ambulatory Visit (INDEPENDENT_AMBULATORY_CARE_PROVIDER_SITE_OTHER)

## 2023-11-19 ENCOUNTER — Ambulatory Visit: Admitting: Podiatry

## 2023-11-19 ENCOUNTER — Encounter: Payer: Self-pay | Admitting: Podiatry

## 2023-11-19 VITALS — Ht 68.0 in | Wt 181.0 lb

## 2023-11-19 DIAGNOSIS — M79671 Pain in right foot: Secondary | ICD-10-CM | POA: Diagnosis not present

## 2023-11-19 DIAGNOSIS — M79672 Pain in left foot: Secondary | ICD-10-CM | POA: Diagnosis not present

## 2023-11-19 DIAGNOSIS — M722 Plantar fascial fibromatosis: Secondary | ICD-10-CM

## 2023-11-19 DIAGNOSIS — M7662 Achilles tendinitis, left leg: Secondary | ICD-10-CM | POA: Diagnosis not present

## 2023-11-19 MED ORDER — METHYLPREDNISOLONE 4 MG PO TBPK
ORAL_TABLET | ORAL | 0 refills | Status: DC
Start: 2023-11-19 — End: 2023-12-24

## 2023-11-19 MED ORDER — TRIAMCINOLONE ACETONIDE 10 MG/ML IJ SUSP
10.0000 mg | Freq: Once | INTRAMUSCULAR | Status: AC
Start: 2023-11-19 — End: ?

## 2023-11-19 NOTE — Progress Notes (Signed)
 Chief Complaint  Patient presents with   Foot Pain    He reports he has had foot pain for a long time and the left is more painful than right in the heel area"    HPI: 63 y.o. male presents today with bilateral heel pain.  Mostly on the left side.  The right is pretty mild.  He was seeing Dr. Marlow Sin for this back in 2021. At some point it appears he has had haglund's resection as well.  Past Medical History:  Diagnosis Date   CTS (carpal tunnel syndrome)    Diabetes mellitus without complication (HCC)    Hyperlipemia    Hypertension    Zenker's diverticulum     Past Surgical History:  Procedure Laterality Date   CARPAL TUNNEL RELEASE Left 12/31/2014   Procedure: LEFT ENDOSCOPIC CARPAL TUNNEL RELEASE ;  Surgeon: Rober Chimera, MD;  Location: [facility];  Service: Orthopedics;  Laterality: Left;   COLONOSCOPY     FOOT SURGERY     SHOULDER SURGERY     ZENKER'S DIVERTICULECTOMY ENDOSCOPIC N/A 12/10/2017   Procedure: ZENKER'S DIVERTICULECTOMY ENDOSCOPIC;  Surgeon: Janita Mellow, MD;  Location: [facility] OR;  Service: ENT;  Laterality: N/A;    No Known Allergies  ROS denies nausea, vomiting, fever, chills, chest pain, shortness of breath   Physical Exam: There were no vitals filed for this visit.  General: The patient is alert and oriented x3 in no acute distress.  Dermatology: Skin is warm, dry and supple bilateral lower extremities. Interspaces are clear of maceration and debris.    Vascular: Palpable pedal pulses bilaterally. Capillary refill within normal limits.  No appreciable edema.  No erythema or calor.  Neurological: Light touch sensation grossly intact bilateral feet.   Musculoskeletal Exam: Left foot tenderness on palpation of the medial calcaneal tubercle.  No pain with calcaneal squeeze.  Decreased ankle joint range of motion with positive Silfverskiold test.  No gaps in plantar fascia.  Some tenderness to the posterior Achilles insertion.  Very mild  tenderness on palpation of the right heel in similar distribution.  Equinus noted.  Radiographic Exam: Left and right foot 3 views weightbearing 11/19/2023 Normal osseous mineralization. Joint spaces preserved.  Thickening of plantar fascia noted.  Left foot plantar calcaneal spur noted.  Some mild spurring posterior calcaneus at Achilles insertion.  Anterior spurring of talus noted.  Right foot anchors are noted within the posterior calcaneus from prior Haglund surgery.  There is spurring present plantar and posterior superior calcaneus.  Assessment/Plan of Care: 1. Bilateral plantar fasciitis   2. Achilles tendinitis of left lower extremity      Meds ordered this encounter  Medications   triamcinolone acetonide (KENALOG) 10 MG/ML injection 10 mg   methylPREDNISolone (MEDROL DOSEPAK) 4 MG TBPK tablet    Sig: 6 Day Tapering Dose    Dispense:  21 tablet    Refill:  0   None  Discussed clinical findings with patient today.  Radiographs reviewed with patient  Discussed use of stretching, physical therapy, night splint, bracing and corticosteroid injections for plantar fasciitis with patient. Stretching, home PT and night splint for the achilles tendinitis as well.  Verbal consent obtained to administer corticosteroid injection to the left plantar fascial origin.  Alcohol skin prep.  0.5 cc of 0.5% Marcaine plain, 0.5 cc of 2% lidocaine plain and 1 cc of Kenalog 10 was injected.  Band-Aid applied.  Patient tolerated well.  Stretching regimen discussed with the  patient at length.  Patient stating that the right heel pain is mild today, deferring injection care.  Prescription sent for oral Medrol Dosepak.  Patient is unable to take oral NSAIDs.  Did discuss heel lifts for the Achilles tendinitis as well. May need CAM boot immobilization if there is no improvement.   Follow-up in 3 weeks for reevaluation   Evarose Altland L. Lunda Salines, AACFAS [facility]     [address] [place], [place] [address]                Office [phone]  Fax [phone]

## 2023-11-19 NOTE — Patient Instructions (Signed)

## 2023-12-24 ENCOUNTER — Ambulatory Visit: Admitting: Podiatry

## 2023-12-24 DIAGNOSIS — G2581 Restless legs syndrome: Secondary | ICD-10-CM | POA: Diagnosis not present

## 2023-12-24 DIAGNOSIS — M722 Plantar fascial fibromatosis: Secondary | ICD-10-CM

## 2023-12-24 DIAGNOSIS — M7662 Achilles tendinitis, left leg: Secondary | ICD-10-CM | POA: Diagnosis not present

## 2023-12-24 NOTE — Progress Notes (Signed)
 Chief Complaint  Patient presents with   Plantar Fasciitis    RM#2 Follow up on left foot pain patient states feeling better.    HPI: 63 y.o. male presents today with bilateral heel pain.  Doing very well from previous.  Does complain of some pain to the left posterior heel intermittently.  Injections are very helpful.  In keeping with stretching.  He does complain of shooting sensations in the legs and some involuntary movements, having more at night. Past Medical History:  Diagnosis Date   CTS (carpal tunnel syndrome)    Diabetes mellitus without complication (HCC)    Hyperlipemia    Hypertension    Zenker's diverticulum     Past Surgical History:  Procedure Laterality Date   CARPAL TUNNEL RELEASE Left 12/31/2014   Procedure: LEFT ENDOSCOPIC CARPAL TUNNEL RELEASE ;  Surgeon: Rober Chimera, MD;  Location: [facility];  Service: Orthopedics;  Laterality: Left;   COLONOSCOPY     FOOT SURGERY     SHOULDER SURGERY     ZENKER'S DIVERTICULECTOMY ENDOSCOPIC N/A 12/10/2017   Procedure: ZENKER'S DIVERTICULECTOMY ENDOSCOPIC;  Surgeon: Janita Mellow, MD;  Location: [facility] OR;  Service: ENT;  Laterality: N/A;    No Known Allergies  ROS denies nausea, vomiting, fever, chills, chest pain, shortness of breath   Physical Exam: There were no vitals filed for this visit.  General: The patient is alert and oriented x3 in no acute distress.  Dermatology: Skin is warm, dry and supple bilateral lower extremities. Interspaces are clear of maceration and debris.    Vascular: Palpable pedal pulses bilaterally. Capillary refill within normal limits.  No appreciable edema.  No erythema or calor.  Neurological: Light touch sensation grossly intact bilateral feet.  Subjective paresthesias  Musculoskeletal Exam: No pain on palpation of bilateral plantar fascia or bilateral heel ulcer medial or plantar central.  Mild tenderness over posterior heel intermittently.  Radiographic Exam:  Left and right foot 3 views weightbearing 11/19/2023 Normal osseous mineralization. Joint spaces preserved.  Thickening of plantar fascia noted.  Left foot plantar calcaneal spur noted.  Some mild spurring posterior calcaneus at Achilles insertion.  Anterior spurring of talus noted.  Right foot anchors are noted within the posterior calcaneus from prior Haglund surgery.  There is spurring present plantar and posterior superior calcaneus.  Assessment/Plan of Care: 1. Bilateral plantar fasciitis   2. Achilles tendinitis of left lower extremity   3. Restless leg syndrome      No orders of the defined types were placed in this encounter.  AMB REFERRAL TO NEUROLOGY  Doing well from the plantar fasciitis standpoint at this point.  Does still have some intermittent pain over the posterior heel where there is some posterior spurring.  Did previously have right foot Haglund's surgery.  We discussed over-the-counter heel tendinitis gel sleeve which can pad over the area.  It sounds as though he is complaining of restless leg type symptoms.  Did explain that these could be neurologic or even due to venous insufficiency in etiology.  I am placing referral to neurology at this point.  Follow-up as needed if heel pain returns or worsens.   Suzi Hernan L. Lunda Salines, AACFAS [facility]     [address]  [place], [place] [address]                Office [phone]  Fax [phone]

## 2023-12-24 NOTE — Patient Instructions (Signed)
 I have ordered a referral to neurology for restless leg syndrome. If you do not hear for them about scheduling within the next 1 week, or you have any questions please give us  a call at [phone].

## 2023-12-27 ENCOUNTER — Encounter: Payer: Self-pay | Admitting: Podiatry

## 2024-02-17 DIAGNOSIS — Z6827 Body mass index (BMI) 27.0-27.9, adult: Secondary | ICD-10-CM | POA: Diagnosis not present

## 2024-02-17 DIAGNOSIS — G629 Polyneuropathy, unspecified: Secondary | ICD-10-CM | POA: Diagnosis not present

## 2024-02-22 DIAGNOSIS — G629 Polyneuropathy, unspecified: Secondary | ICD-10-CM | POA: Diagnosis not present

## 2024-06-06 DIAGNOSIS — R051 Acute cough: Secondary | ICD-10-CM | POA: Diagnosis not present

## 2024-06-06 DIAGNOSIS — R509 Fever, unspecified: Secondary | ICD-10-CM | POA: Diagnosis not present

## 2024-06-06 DIAGNOSIS — J029 Acute pharyngitis, unspecified: Secondary | ICD-10-CM | POA: Diagnosis not present
